# Patient Record
Sex: Female | Born: 1965 | Race: Black or African American | Hispanic: No | Marital: Single | Smoking: Current every day smoker
Health system: Southern US, Community
[De-identification: ages and names within clinical notes are randomized; demographics above are authoritative.]

## PROBLEM LIST (undated history)

## (undated) DIAGNOSIS — K219 Gastro-esophageal reflux disease without esophagitis: Secondary | ICD-10-CM

## (undated) DIAGNOSIS — J9819 Other pulmonary collapse: Secondary | ICD-10-CM

## (undated) DIAGNOSIS — D649 Anemia, unspecified: Secondary | ICD-10-CM

## (undated) HISTORY — PX: ABDOMINAL HYSTERECTOMY: SHX81

## (undated) HISTORY — PX: CHEST TUBE INSERTION: SHX231

## (undated) HISTORY — PX: OTHER SURGICAL HISTORY: SHX169

## (undated) SURGERY — LAPAROSCOPIC CHOLECYSTECTOMY
Anesthesia: General

---

## 2005-05-12 ENCOUNTER — Emergency Department: Payer: Self-pay | Admitting: Unknown Physician Specialty

## 2005-05-27 ENCOUNTER — Ambulatory Visit: Payer: Self-pay | Admitting: Family Medicine

## 2005-09-22 ENCOUNTER — Emergency Department: Payer: Self-pay | Admitting: Emergency Medicine

## 2007-06-24 ENCOUNTER — Emergency Department: Payer: Self-pay | Admitting: Emergency Medicine

## 2009-02-06 ENCOUNTER — Emergency Department: Payer: Self-pay | Admitting: Emergency Medicine

## 2010-06-01 ENCOUNTER — Emergency Department: Payer: Self-pay | Admitting: Internal Medicine

## 2011-01-08 ENCOUNTER — Emergency Department: Payer: Self-pay | Admitting: Emergency Medicine

## 2011-03-10 ENCOUNTER — Emergency Department: Payer: Self-pay | Admitting: Emergency Medicine

## 2015-05-23 ENCOUNTER — Emergency Department
Admission: EM | Admit: 2015-05-23 | Discharge: 2015-05-23 | Disposition: A | Discharge status: Home / Self Care | Payer: Self-pay | Attending: Emergency Medicine | Admitting: Emergency Medicine

## 2015-05-23 ENCOUNTER — Encounter: Payer: Self-pay | Admitting: *Deleted

## 2015-05-23 DIAGNOSIS — Z720 Tobacco use: Secondary | ICD-10-CM | POA: Insufficient documentation

## 2015-05-23 DIAGNOSIS — R079 Chest pain, unspecified: Secondary | ICD-10-CM | POA: Insufficient documentation

## 2015-05-23 DIAGNOSIS — K219 Gastro-esophageal reflux disease without esophagitis: Secondary | ICD-10-CM | POA: Insufficient documentation

## 2015-05-23 HISTORY — DX: Other pulmonary collapse: J98.19

## 2015-05-23 LAB — COMPREHENSIVE METABOLIC PANEL
ALBUMIN: 4 g/dL (ref 3.5–5.0)
ALK PHOS: 54 U/L (ref 38–126)
ALT: 14 U/L (ref 14–54)
AST: 19 U/L (ref 15–41)
Anion gap: 8 (ref 5–15)
BUN: 17 mg/dL (ref 6–20)
CO2: 29 mmol/L (ref 22–32)
CREATININE: 0.72 mg/dL (ref 0.44–1.00)
Calcium: 9.2 mg/dL (ref 8.9–10.3)
Chloride: 104 mmol/L (ref 101–111)
GFR calc non Af Amer: >60 mL/min (ref >60)
Glucose, Bld: 151 mg/dL — ABNORMAL HIGH (ref 65–99)
POTASSIUM: 3.5 mmol/L (ref 3.5–5.1)
Sodium: 141 mmol/L (ref 135–145)
TOTAL PROTEIN: 7.2 g/dL (ref 6.5–8.1)
Total Bilirubin: 0.5 mg/dL (ref 0.3–1.2)

## 2015-05-23 LAB — CBC WITH DIFFERENTIAL/PLATELET
BASOS ABS: 0 10*3/uL (ref 0–0.1)
BASOS PCT: 1 %
Eosinophils Absolute: 0 10*3/uL (ref 0–0.7)
Eosinophils Relative: 0 %
HEMATOCRIT: 36.9 % (ref 35.0–47.0)
HEMOGLOBIN: 11.5 g/dL — AB (ref 12.0–16.0)
LYMPHS PCT: 18 %
Lymphs Abs: 1.3 10*3/uL (ref 1.0–3.6)
MCH: 23.2 pg — AB (ref 26.0–34.0)
MCHC: 31.2 g/dL — AB (ref 32.0–36.0)
MCV: 74.2 fL — ABNORMAL LOW (ref 80.0–100.0)
MONO ABS: 0.3 10*3/uL (ref 0.2–0.9)
Monocytes Relative: 4 %
Neutro Abs: 5.5 10*3/uL (ref 1.4–6.5)
Neutrophils Relative %: 77 %
PLATELETS: 229 10*3/uL (ref 150–440)
RBC: 4.98 MIL/uL (ref 3.80–5.20)
RDW: 14.8 % — ABNORMAL HIGH (ref 11.5–14.5)
WBC: 7.1 10*3/uL (ref 3.6–11.0)

## 2015-05-23 LAB — TROPONIN I: Troponin I: <0.03 ng/mL (ref <0.031)

## 2015-05-23 MED ORDER — SUCRALFATE 1 G PO TABS: 1.0000 g | ORAL_TABLET | Freq: Four times a day (QID) | ORAL | Status: DC

## 2015-05-23 MED ORDER — FAMOTIDINE 20 MG PO TABS
40.0000 mg | ORAL_TABLET | Freq: Once | ORAL | Status: AC
  Administered 2015-05-23: 40 mg via ORAL
  Filled 2015-05-23: qty 2

## 2015-05-23 MED ORDER — RANITIDINE HCL 150 MG PO CAPS: 150.0000 mg | ORAL_CAPSULE | Freq: Two times a day (BID) | ORAL | Status: DC

## 2015-05-23 MED ORDER — GI COCKTAIL ~~LOC~~
30.0000 mL | ORAL | Status: AC
Start: 2015-05-23 — End: 2015-05-23
  Administered 2015-05-23: 30 mL via ORAL
  Filled 2015-05-23: qty 30

## 2015-05-23 NOTE — ED Provider Notes (Signed)
[facility] Emergency Department Provider Note  ____________________________________________  Time seen: 9:05 AM, on arrival by EMS  I have reviewed the triage vital signs and the nursing notes.   HISTORY  Chief Complaint Chest Pain and Abdominal Pain    HPI Cathy Sanchez is a 49 y.o. female who comes to the ED because of chest pain. The pain was epigastric and midsternal, burning and "a knot", nonradiating. The patient woke up around 5:00 this morning to go to the bathroom, at around 6:00 she had a gradual onset of this pain. She also felt nauseated and like her stomach was full. She did not try anything at home, but the pain was worse with movement and lying on her left side. On arrival to the ED she vomited once and the pain resolved. She reports that she does frequently have belching and problems with stomach pain after eating.  The pain is not exertional nor pleuritic. No fevers or chills. No diarrhea melena or bloody stool.   Past Medical History  Diagnosis Date  . Lung collapse     There are no active problems to display for this patient.   Past Surgical History  Procedure Laterality Date  . Abdominal hysterectomy      Current Outpatient Rx  Name  Route  Sig  Dispense  Refill  . ranitidine (ZANTAC) 150 MG capsule   Oral   Take 1 capsule (150 mg total) by mouth 2 (two) times daily.   28 capsule   0   . sucralfate (CARAFATE) 1 G tablet   Oral   Take 1 tablet (1 g total) by mouth 4 (four) times daily.   120 tablet   1     Allergies Review of patient's allergies indicates no known allergies.  History reviewed. No pertinent family history.  Social History History  Substance Use Topics  . Smoking status: Current Every Day Smoker  . Smokeless tobacco: Not on file  . Alcohol Use: Not on file    Review of Systems  Constitutional: No fever or chills. No weight changes Eyes:No blurry vision or double vision.  ENT: No sore  throat. Cardiovascular: Positive chest pain as above. Respiratory: No dyspnea or cough. Gastrointestinal: Negative for abdominal pain, vomiting and diarrhea.  No BRBPR or melena. Genitourinary: Negative for dysuria, urinary retention, bloody urine, or difficulty urinating. Musculoskeletal: Negative for back pain. No joint swelling or pain. Skin: Negative for rash. Neurological: Negative for headaches, focal weakness or numbness. Psychiatric:No anxiety or depression.   Endocrine:No hot/cold intolerance, changes in energy, or sleep difficulty.  10-point ROS otherwise negative.  ____________________________________________   PHYSICAL EXAM:  VITAL SIGNS: ED Triage Vitals  Enc Vitals Group     BP 05/23/15 0917 159/100 mmHg     Pulse Rate 05/23/15 0917 88     Resp 05/23/15 0917 20     Temp 05/23/15 0917 98.1 F (36.7 C)     Temp Source 05/23/15 0917 Oral     SpO2 05/23/15 0917 96 %     Weight 05/23/15 0917 116 lb (52.617 kg)     Height 05/23/15 0917 5\' 4"  (1.626 m)     Head Cir --      Peak Flow --      Pain Score 05/23/15 0918 4     Pain Loc --      Pain Edu? --      Excl. in [place]? --      Constitutional: Alert and oriented. Well appearing and  in no distress. Eyes: No scleral icterus. No conjunctival pallor. PERRL. EOMI ENT   Head: Normocephalic and atraumatic.   Nose: No congestion/rhinnorhea. No septal hematoma   Mouth/Throat: MMM, no pharyngeal erythema. No peritonsillar mass. No uvula shift.   Neck: No stridor. No SubQ emphysema. No meningismus. Hematological/Lymphatic/Immunilogical: No cervical lymphadenopathy. Cardiovascular: RRR. Normal and symmetric distal pulses are present in all extremities. No murmurs, rubs, or gallops. Respiratory: Normal respiratory effort without tachypnea nor retractions. Breath sounds are clear and equal bilaterally. No wheezes/rales/rhonchi. Gastrointestinal: Mild epigastric tenderness. No distention. There is no CVA  tenderness.  No rebound, rigidity, or guarding. Genitourinary: deferred Musculoskeletal: Nontender with normal range of motion in all extremities. No joint effusions.  No lower extremity tenderness.  No edema. Neurologic:   Normal speech and language.  CN 2-10 normal. Motor grossly intact. No pronator drift.  Normal gait. No gross focal neurologic deficits are appreciated.  Skin:  Skin is warm, dry and intact. No rash noted.  No petechiae, purpura, or bullae. Psychiatric: Mood and affect are normal. Speech and behavior are normal. Patient exhibits appropriate insight and judgment.  ____________________________________________    LABS (pertinent positives/negatives) (all labs ordered are listed, but only abnormal results are displayed) Labs Reviewed  COMPREHENSIVE METABOLIC PANEL - Abnormal; Notable for the following:    Glucose, Bld 151 (*)    All other components within normal limits  CBC WITH DIFFERENTIAL/PLATELET - Abnormal; Notable for the following:    Hemoglobin 11.5 (*)    MCV 74.2 (*)    MCH 23.2 (*)    MCHC 31.2 (*)    RDW 14.8 (*)    All other components within normal limits  TROPONIN I   ____________________________________________   EKG  Interpreted by me Atrial fibrillation rate of 95, normal axis and intervals, normal QRS ST segments and T waves  ____________________________________________    RADIOLOGY    ____________________________________________   PROCEDURES  ____________________________________________   INITIAL IMPRESSION / ASSESSMENT AND PLAN / ED COURSE  Pertinent labs & imaging results that were available during my care of the patient were reviewed by me and considered in my medical decision making (see chart for details).  Patient presents with chest pain and epigastric pain low suspicion for ACS TAD triple a pneumothorax PE carditis mediastinitis cholecystitis or cholangitis. Low suspicion for perforated viscus. History and exam are  consistent with GERD and gastritis. We will give the patient antacids while checking some labs. No evidence of ischemic changes on EKG. Patient is low risk by heart score.  ----------------------------------------- 11:46 AM on 05/23/2015 -----------------------------------------  Feeling better, troponin negative, we'll discharge home on antacids.  ____________________________________________   FINAL CLINICAL IMPRESSION(S) / ED DIAGNOSES  Final diagnoses:  Chest pain, unspecified chest pain type  Gastroesophageal reflux disease without esophagitis      Carrie Mew, MD 05/23/15 1146

## 2015-05-23 NOTE — Discharge Instructions (Signed)
Chest Pain (Nonspecific) °It is often hard to give a specific diagnosis for the cause of chest pain. There is always a chance that your pain could be related to something serious, such as a heart attack or a blood clot in the lungs. You need to follow up with your health care provider for further evaluation. °CAUSES  °· Heartburn. °· Pneumonia or bronchitis. °· Anxiety or stress. °· Inflammation around your heart (pericarditis) or lung (pleuritis or pleurisy). °· A blood clot in the lung. °· A collapsed lung (pneumothorax). It can develop suddenly on its own (spontaneous pneumothorax) or from trauma to the chest. °· Shingles infection (herpes zoster virus). °The chest wall is composed of bones, muscles, and cartilage. Any of these can be the source of the pain. °· The bones can be bruised by injury. °· The muscles or cartilage can be strained by coughing or overwork. °· The cartilage can be affected by inflammation and become sore (costochondritis). °DIAGNOSIS  °Lab tests or other studies may be needed to find the cause of your pain. Your health care provider may have you take a test called an ambulatory electrocardiogram (ECG). An ECG records your heartbeat patterns over a 24-hour period. You may also have other tests, such as: °· Transthoracic echocardiogram (TTE). During echocardiography, sound waves are used to evaluate how blood flows through your heart. °· Transesophageal echocardiogram (TEE). °· Cardiac monitoring. This allows your health care provider to monitor your heart rate and rhythm in real time. °· Holter monitor. This is a portable device that records your heartbeat and can help diagnose heart arrhythmias. It allows your health care provider to track your heart activity for several days, if needed. °· Stress tests by exercise or by giving medicine that makes the heart beat faster. °TREATMENT  °· Treatment depends on what may be causing your chest pain. Treatment may include: °¨ Acid blockers for  heartburn. °¨ Anti-inflammatory medicine. °¨ Pain medicine for inflammatory conditions. °¨ Antibiotics if an infection is present. °· You may be advised to change lifestyle habits. This includes stopping smoking and avoiding alcohol, caffeine, and chocolate. °· You may be advised to keep your head raised (elevated) when sleeping. This reduces the chance of acid going backward from your stomach into your esophagus. °Most of the time, nonspecific chest pain will improve within 2-3 days with rest and mild pain medicine.  °HOME CARE INSTRUCTIONS  °· If antibiotics were prescribed, take them as directed. Finish them even if you start to feel better. °· For the next few days, avoid physical activities that bring on chest pain. Continue physical activities as directed. °· Do not use any tobacco products, including cigarettes, chewing tobacco, or electronic cigarettes. °· Avoid drinking alcohol. °· Only take medicine as directed by your health care provider. °· Follow your health care provider's suggestions for further testing if your chest pain does not go away. °· Keep any follow-up appointments you made. If you do not go to an appointment, you could develop lasting (chronic) problems with pain. If there is any problem keeping an appointment, call to reschedule. °SEEK MEDICAL CARE IF:  °· Your chest pain does not go away, even after treatment. °· You have a rash with blisters on your chest. °· You have a fever. °SEEK IMMEDIATE MEDICAL CARE IF:  °· You have increased chest pain or pain that spreads to your arm, neck, jaw, back, or abdomen. °· You have shortness of breath. °· You have an increasing cough, or you cough   up blood.  You have severe back or abdominal pain.  You feel nauseous or vomit.  You have severe weakness.  You faint.  You have chills. This is an emergency. Do not wait to see if the pain will go away. Get medical help at once. Call your local emergency services (911 in [place] Do not drive  yourself to the hospital. MAKE SURE YOU:   Understand these instructions.  Will watch your condition.  Will get help right away if you are not doing well or get worse. Document Released: 08/04/2005 Document Revised: 10/30/2013 Document Reviewed: 05/30/2008 [facility] Patient Information 2015 [place], [place]. This information is not intended to replace advice given to you by your health care provider. Make sure you discuss any questions you have with your health care provider.  Gastroesophageal Reflux Disease, Adult Gastroesophageal reflux disease (GERD) happens when acid from your stomach goes into your food pipe (esophagus). The acid can cause a burning feeling in your chest. Over time, the acid can make small holes (ulcers) in your food pipe.  HOME CARE  Ask your doctor for advice about:  Losing weight.  Quitting smoking.  Alcohol use.  Avoid foods and drinks that make your problems worse. You may want to avoid:  Caffeine and alcohol.  Chocolate.  Mints.  Garlic and onions.  Spicy foods.  Citrus fruits, such as oranges, lemons, or limes.  Foods that contain tomato, such as sauce, chili, salsa, and pizza.  Fried and fatty foods.  Avoid lying down for 3 hours before you go to bed or before you take a nap.  Eat small meals often, instead of large meals.  Wear loose-fitting clothing. Do not wear anything tight around your waist.  Raise (elevate) the head of your bed 6 to 8 inches with wood blocks. Using extra pillows does not help.  Only take medicines as told by your doctor.  Do not take aspirin or ibuprofen. GET HELP RIGHT AWAY IF:   You have pain in your arms, neck, jaw, teeth, or back.  Your pain gets worse or changes.  You feel sick to your stomach (nauseous), throw up (vomit), or sweat (diaphoresis).  You feel short of breath, or you pass out (faint).  Your throw up is green, yellow, black, or looks like coffee grounds or blood.  Your poop (stool) is  red, bloody, or black. MAKE SURE YOU:   Understand these instructions.  Will watch your condition.  Will get help right away if you are not doing well or get worse. Document Released: 04/12/2008 Document Revised: 01/17/2012 Document Reviewed: 05/14/2011 [facility] Patient Information 2015 [place], [place]. This information is not intended to replace advice given to you by your health care provider. Make sure you discuss any questions you have with your health care provider.

## 2015-05-23 NOTE — ED Notes (Signed)
Pt arrives via EMS from home with complaints of chest pain mid chest, EMs reports pt was cool and clammy upon arrival, upon ER arrival pt vomited once

## 2015-06-01 ENCOUNTER — Emergency Department
Admission: EM | Admit: 2015-06-01 | Discharge: 2015-06-01 | Disposition: A | Discharge status: Home / Self Care | Payer: Self-pay | Attending: Emergency Medicine | Admitting: Emergency Medicine

## 2015-06-01 DIAGNOSIS — S01511A Laceration without foreign body of lip, initial encounter: Secondary | ICD-10-CM | POA: Insufficient documentation

## 2015-06-01 DIAGNOSIS — Z720 Tobacco use: Secondary | ICD-10-CM | POA: Insufficient documentation

## 2015-06-01 DIAGNOSIS — Y998 Other external cause status: Secondary | ICD-10-CM | POA: Insufficient documentation

## 2015-06-01 DIAGNOSIS — Z79899 Other long term (current) drug therapy: Secondary | ICD-10-CM | POA: Insufficient documentation

## 2015-06-01 DIAGNOSIS — Z23 Encounter for immunization: Secondary | ICD-10-CM | POA: Insufficient documentation

## 2015-06-01 DIAGNOSIS — Y9289 Other specified places as the place of occurrence of the external cause: Secondary | ICD-10-CM | POA: Insufficient documentation

## 2015-06-01 DIAGNOSIS — W01190A Fall on same level from slipping, tripping and stumbling with subsequent striking against furniture, initial encounter: Secondary | ICD-10-CM | POA: Insufficient documentation

## 2015-06-01 DIAGNOSIS — Y9389 Activity, other specified: Secondary | ICD-10-CM | POA: Insufficient documentation

## 2015-06-01 MED ORDER — TETANUS-DIPHTH-ACELL PERTUSSIS 5-2.5-18.5 LF-MCG/0.5 IM SUSP
0.5000 mL | Freq: Once | INTRAMUSCULAR | Status: AC
  Administered 2015-06-01: 0.5 mL via INTRAMUSCULAR
  Filled 2015-06-01: qty 0.5

## 2015-06-01 MED ORDER — TRAMADOL HCL 50 MG PO TABS: 50.0000 mg | ORAL_TABLET | Freq: Four times a day (QID) | ORAL | Status: AC | PRN

## 2015-06-01 MED ORDER — LIDOCAINE HCL (PF) 1 % IJ SOLN
INTRAMUSCULAR | Status: AC
  Administered 2015-06-01: 5 mL
  Filled 2015-06-01: qty 5

## 2015-06-01 MED ORDER — LIDOCAINE HCL (PF) 1 % IJ SOLN
INTRAMUSCULAR | Status: AC
Start: 2015-06-01 — End: 2015-06-01
  Administered 2015-06-01: 5 mL
  Filled 2015-06-01: qty 5

## 2015-06-01 MED ORDER — AMOXICILLIN-POT CLAVULANATE 875-125 MG PO TABS: 1.0000 | ORAL_TABLET | Freq: Two times a day (BID) | ORAL | Status: AC

## 2015-06-01 NOTE — ED Notes (Signed)
Pt reports falling and hitting lip on table approx 1 hr ago. +laceration to upper lip. Denies LOC.

## 2015-06-01 NOTE — Discharge Instructions (Signed)
Absorbable Suture Repair Absorbable sutures (stitches) hold skin together so you can heal. Keep skin wounds clean and dry for the next 2 to 3 days. Then, you may gently wash your wound and dress it with an antibiotic ointment as recommended. As your wound begins to heal, the sutures are no longer needed, and they typically begin to fall off. This will take 7 to 10 days. After 10 days, if your sutures are loose, you can remove them by wiping with a clean gauze pad or a cotton ball. Do not pull your sutures out. They should wipe away easily. If after 10 days they do not easily wipe away, have your caregiver take them out. Absorbable sutures may be used deep in a wound to help hold it together. If these stitches are below the skin, the body will absorb them completely in 3 to 4 weeks.  You may need a tetanus shot if:  You cannot remember when you had your last tetanus shot.  You have never had a tetanus shot. If you get a tetanus shot, your arm may swell, get red, and feel warm to the touch. This is common and not a problem. If you need a tetanus shot and you choose not to have one, there is a rare chance of getting tetanus. Sickness from tetanus can be serious. SEEK IMMEDIATE MEDICAL CARE IF:  You have redness in the wound area.  The wound area feels hot to the touch.  You develop swelling in the wound area.  You develop pain.  There is fluid drainage from the wound. Document Released: 12/02/2004 Document Revised: 01/17/2012 Document Reviewed: 03/16/2011 [facility] Patient Information 2015 [place], [place]. This information is not intended to replace advice given to you by your health care provider. Make sure you discuss any questions you have with your health care provider.  Facial Laceration A facial laceration is a cut on the face. These injuries can be painful and cause bleeding. Some cuts may need to be closed with stitches (sutures), skin adhesive strips, or wound glue. Cuts usually heal  quickly but can leave a scar. It can take 1-2 years for the scar to go away completely. HOME CARE   Only take medicines as told by your doctor.  Follow your doctor's instructions for wound care. For Stitches:  Keep the cut clean and dry.  If you have a bandage (dressing), change it at least once a day. Change the bandage if it gets wet or dirty, or as told by your doctor.  Wash the cut with soap and water 2 times a day. Rinse the cut with water. Pat it dry with a clean towel.  Put a thin layer of medicated cream on the cut as told by your doctor.  You may shower after the first 24 hours. Do not soak the cut in water until the stitches are removed.  Have your stitches removed as told by your doctor.  Do not wear any makeup until a few days after your stitches are removed. For Skin Adhesive Strips:  Keep the cut clean and dry.  Do not get the strips wet. You may take a bath, but be careful to keep the cut dry.  If the cut gets wet, pat it dry with a clean towel.  The strips will fall off on their own. Do not remove the strips that are still stuck to the cut. For Wound Glue:  You may shower or take baths. Do not soak or scrub the cut. Do  not swim. Avoid heavy sweating until the glue falls off on its own. After a shower or bath, pat the cut dry with a clean towel.  Do not put medicine or makeup on your cut until the glue falls off.  If you have a bandage, do not put tape over the glue.  Avoid lots of sunlight or tanning lamps until the glue falls off.  The glue will fall off on its own in 5-10 days. Do not pick at the glue. After Healing: Put sunscreen on the cut for the first year to reduce your scar. GET HELP RIGHT AWAY IF:   Your cut area gets red, painful, or puffy (swollen).  You see a yellowish-white fluid (pus) coming from the cut.  You have chills or a fever. MAKE SURE YOU:   Understand these instructions.  Will watch your condition.  Will get help right  away if you are not doing well or get worse. Document Released: 04/12/2008 Document Revised: 08/15/2013 Document Reviewed: 06/07/2013 [facility] Patient Information 2015 [place], [place]. This information is not intended to replace advice given to you by your health care provider. Make sure you discuss any questions you have with your health care provider.

## 2015-06-01 NOTE — ED Provider Notes (Signed)
[facility] Emergency Department Provider Note  Time seen: 2:39 AM  I have reviewed the triage vital signs and the nursing notes.   HISTORY  Chief Complaint Lip Laceration    HPI Cathy Sanchez is a 49 y.o. female who presents the emergency department with a lip laceration following a fall today. According to the patient she was drinking alcohol, she tripped and fell hitting her mouth on a coffee table. Patient denies any loss of consciousness. She suffered a laceration to her lip which brought her to the emergency department for repair. Describes her lip pain as moderate. Bleeding is controlled at this time.     Past Medical History  Diagnosis Date  . Lung collapse     There are no active problems to display for this patient.   Past Surgical History  Procedure Laterality Date  . Abdominal hysterectomy      Current Outpatient Rx  Name  Route  Sig  Dispense  Refill  . ranitidine (ZANTAC) 150 MG capsule   Oral   Take 1 capsule (150 mg total) by mouth 2 (two) times daily.   28 capsule   0   . sucralfate (CARAFATE) 1 G tablet   Oral   Take 1 tablet (1 g total) by mouth 4 (four) times daily.   120 tablet   1     Allergies Review of patient's allergies indicates no known allergies.  No family history on file.  Social History History  Substance Use Topics  . Smoking status: Current Every Day Smoker  . Smokeless tobacco: Not on file  . Alcohol Use: Not on file    Review of Systems Constitutional: Negative for fever. Cardiovascular: Negative for chest pain. Respiratory: Negative for shortness of breath. Gastrointestinal: Negative for abdominal pain Skin: Negative for rash. Laceration to lip. Neurological: Negative for headaches, focal weakness or numbness.  10-point ROS otherwise negative.  ____________________________________________   PHYSICAL EXAM:  VITAL SIGNS: ED Triage Vitals  Enc Vitals Group     BP 06/01/15 0142  122/74 mmHg     Pulse Rate 06/01/15 0142 78     Resp 06/01/15 0142 20     Temp 06/01/15 0142 98.6 F (37 C)     Temp Source 06/01/15 0142 Oral     SpO2 06/01/15 0142 98 %     Weight 06/01/15 0142 150 lb (68.04 kg)     Height 06/01/15 0142 5\' 3"  (1.6 m)     Head Cir --      Peak Flow --      Pain Score 06/01/15 0143 5     Pain Loc --      Pain Edu? --      Excl. in [place]? --     Constitutional: Alert and oriented. Well appearing and in no distress. Eyes: Normal exam, 2 mm PERRL bilaterally ENT   Head: Normocephalic   Nose: Atraumatic   Mouth/Throat: Patient with complete laceration of upper lip. Laceration is approximately 4 cm in total length. No dental or other injuries noted Cardiovascular: Normal rate, regular rhythm. No murmur Respiratory: Normal respiratory effort without tachypnea nor retractions. Breath sounds are clear Gastrointestinal: Soft and nontender. No distention.   Musculoskeletal: Nontender with normal range of motion in all extremities. Neurologic:  Normal speech and language. No gross focal neurologic deficits are appreciated. Speech is normal. Skin:  Skin is warm, dry. Laceration as above. Psychiatric: Mood and affect are normal. ____________________________________________    INITIAL IMPRESSION / ASSESSMENT  AND PLAN / ED COURSE  Pertinent labs & imaging results that were available during my care of the patient were reviewed by me and considered in my medical decision making (see chart for details).  Patient presents with a complicated laceration following a mechanical fall. No LOC, no other injuries. Patient does have a complete laceration of the upper lip. I repaired with 5-0 rapid Vicryls suture, including deep sutures, and approximation of the vermilion border.  Repair went very well, without complications. The patient tolerated the procedure very well. With great approximation of the vermilion border. Expect minimal scarring. We will cover the  patient with Augmentin given the depth of laceration and location.  LACERATION REPAIR Performed by: Harvest Dark Authorized by: Harvest Dark Consent: Verbal consent obtained. Risks and benefits: risks, benefits and alternatives were discussed Consent given by: patient Patient identity confirmed: provided demographic data Prepped and Draped in normal sterile fashion Wound explored  Laceration Location: Left upper lip  Laceration Length: 4 cm  No Foreign Bodies seen or palpated  Anesthesia: local infiltration  Local anesthetic: lidocaine 1 % without epinephrine  Anesthetic total: 8 ml  Irrigation method: syringe Amount of cleaning: standard  Skin closure: 5-0 rapid Vicryls   Number of sutures: 2 deep sutures, 11 superficial sutures.   Technique: Simple interrupted.   Patient tolerance: Patient tolerated the procedure well with no immediate complications.   ____________________________________________   FINAL CLINICAL IMPRESSION(S) / ED DIAGNOSES  Laceration   Harvest Dark, MD 06/01/15 [phone]

## 2016-05-12 ENCOUNTER — Emergency Department
Admission: EM | Admit: 2016-05-12 | Discharge: 2016-05-12 | Disposition: A | Discharge status: Home / Self Care | Payer: Self-pay | Attending: Emergency Medicine | Admitting: Emergency Medicine

## 2016-05-12 DIAGNOSIS — N390 Urinary tract infection, site not specified: Secondary | ICD-10-CM | POA: Insufficient documentation

## 2016-05-12 DIAGNOSIS — K219 Gastro-esophageal reflux disease without esophagitis: Secondary | ICD-10-CM

## 2016-05-12 DIAGNOSIS — F17210 Nicotine dependence, cigarettes, uncomplicated: Secondary | ICD-10-CM | POA: Insufficient documentation

## 2016-05-12 DIAGNOSIS — R1013 Epigastric pain: Secondary | ICD-10-CM

## 2016-05-12 DIAGNOSIS — Z79899 Other long term (current) drug therapy: Secondary | ICD-10-CM | POA: Insufficient documentation

## 2016-05-12 LAB — URINALYSIS COMPLETE WITH MICROSCOPIC (ARMC ONLY)
Bilirubin Urine: NEGATIVE
Glucose, UA: NEGATIVE mg/dL
Ketones, ur: NEGATIVE mg/dL
NITRITE: POSITIVE — AB
PH: 6 (ref 5.0–8.0)
PROTEIN: NEGATIVE mg/dL
SPECIFIC GRAVITY, URINE: 1.02 (ref 1.005–1.030)

## 2016-05-12 LAB — TROPONIN I: Troponin I: <0.03 ng/mL (ref <0.03)

## 2016-05-12 LAB — CBC
HEMATOCRIT: 36.2 % (ref 35.0–47.0)
Hemoglobin: 11.8 g/dL — ABNORMAL LOW (ref 12.0–16.0)
MCH: 23.4 pg — ABNORMAL LOW (ref 26.0–34.0)
MCHC: 32.6 g/dL (ref 32.0–36.0)
MCV: 71.8 fL — AB (ref 80.0–100.0)
PLATELETS: 231 10*3/uL (ref 150–440)
RBC: 5.05 MIL/uL (ref 3.80–5.20)
RDW: 14.6 % — AB (ref 11.5–14.5)
WBC: 9.1 10*3/uL (ref 3.6–11.0)

## 2016-05-12 LAB — COMPREHENSIVE METABOLIC PANEL
ALBUMIN: 4 g/dL (ref 3.5–5.0)
ALK PHOS: 67 U/L (ref 38–126)
ALT: 22 U/L (ref 14–54)
AST: 26 U/L (ref 15–41)
Anion gap: 6 (ref 5–15)
BUN: 18 mg/dL (ref 6–20)
CHLORIDE: 106 mmol/L (ref 101–111)
CO2: 28 mmol/L (ref 22–32)
CREATININE: 0.62 mg/dL (ref 0.44–1.00)
Calcium: 9 mg/dL (ref 8.9–10.3)
GFR calc Af Amer: >60 mL/min (ref >60)
GFR calc non Af Amer: >60 mL/min (ref >60)
GLUCOSE: 92 mg/dL (ref 65–99)
Potassium: 3.5 mmol/L (ref 3.5–5.1)
Sodium: 140 mmol/L (ref 135–145)
Total Bilirubin: 0.4 mg/dL (ref 0.3–1.2)
Total Protein: 7 g/dL (ref 6.5–8.1)

## 2016-05-12 LAB — LIPASE, BLOOD: Lipase: 21 U/L (ref 11–51)

## 2016-05-12 MED ORDER — SUCRALFATE 1 G PO TABS: 1.0000 g | ORAL_TABLET | Freq: Four times a day (QID) | ORAL | Status: DC

## 2016-05-12 MED ORDER — NITROFURANTOIN MONOHYD MACRO 100 MG PO CAPS: 100.0000 mg | ORAL_CAPSULE | Freq: Two times a day (BID) | ORAL | Status: DC

## 2016-05-12 MED ORDER — FAMOTIDINE 20 MG PO TABS: 20.0000 mg | ORAL_TABLET | Freq: Two times a day (BID) | ORAL | Status: DC

## 2016-05-12 MED ORDER — GI COCKTAIL ~~LOC~~
30.0000 mL | Freq: Once | ORAL | Status: AC
  Administered 2016-05-12: 30 mL via ORAL
  Filled 2016-05-12: qty 30

## 2016-05-12 MED ORDER — FAMOTIDINE 20 MG PO TABS
20.0000 mg | ORAL_TABLET | Freq: Once | ORAL | Status: AC
  Administered 2016-05-12: 20 mg via ORAL
  Filled 2016-05-12: qty 1

## 2016-05-12 NOTE — ED Notes (Signed)
Pt sound asleep, respirations WNL.. Will continue to monitor the pt.

## 2016-05-12 NOTE — Discharge Instructions (Signed)
Abdominal Pain, Adult °Many things can cause abdominal pain. Usually, abdominal pain is not caused by a disease and will improve without treatment. It can often be observed and treated at home. Your health care provider will do a physical exam and possibly order blood tests and X-rays to help determine the seriousness of your pain. However, in many cases, more time must pass before a clear cause of the pain can be found. Before that point, your health care provider may not know if you need more testing or further treatment. °HOME CARE INSTRUCTIONS °Monitor your abdominal pain for any changes. The following actions may help to alleviate any discomfort you are experiencing: °· Only take over-the-counter or prescription medicines as directed by your health care provider. °· Do not take laxatives unless directed to do so by your health care provider. °· Try a clear liquid diet (broth, tea, or water) as directed by your health care provider. Slowly move to a bland diet as tolerated. °SEEK MEDICAL CARE IF: °· You have unexplained abdominal pain. °· You have abdominal pain associated with nausea or diarrhea. °· You have pain when you urinate or have a bowel movement. °· You experience abdominal pain that wakes you in the night. °· You have abdominal pain that is worsened or improved by eating food. °· You have abdominal pain that is worsened with eating fatty foods. °· You have a fever. °SEEK IMMEDIATE MEDICAL CARE IF: °· Your pain does not go away within 2 hours. °· You keep throwing up (vomiting). °· Your pain is felt only in portions of the abdomen, such as the right side or the left lower portion of the abdomen. °· You pass bloody or black tarry stools. °MAKE SURE YOU: °· Understand these instructions. °· Will watch your condition. °· Will get help right away if you are not doing well or get worse. °  °This information is not intended to replace advice given to you by your health care provider. Make sure you discuss  any questions you have with your health care provider. °  °Document Released: 08/04/2005 Document Revised: 07/16/2015 Document Reviewed: 07/04/2013 °Elsevier Interactive Patient Education ©2016 [employer] °Urinary Tract Infection °Urinary tract infections (UTIs) can develop anywhere along your urinary tract. Your urinary tract is your body's drainage system for removing wastes and extra water. Your urinary tract includes two kidneys, two ureters, a bladder, and a urethra. Your kidneys are a pair of bean-shaped organs. Each kidney is about the size of your fist. They are located below your ribs, one on each side of your spine. °CAUSES °Infections are caused by microbes, which are microscopic organisms, including fungi, viruses, and bacteria. These organisms are so small that they can only be seen through a microscope. Bacteria are the microbes that most commonly cause UTIs. °SYMPTOMS  °Symptoms of UTIs may vary by age and gender of the patient and by the location of the infection. Symptoms in young women typically include a frequent and intense urge to urinate and a painful, burning feeling in the bladder or urethra during urination. Older women and men are more likely to be tired, shaky, and weak and have muscle aches and abdominal pain. A fever may mean the infection is in your kidneys. Other symptoms of a kidney infection include pain in your back or sides below the ribs, nausea, and vomiting. °DIAGNOSIS °To diagnose a UTI, your caregiver will ask you about your symptoms. Your caregiver will also ask you to provide a urine sample. The   urine sample will be tested for bacteria and white blood cells. White blood cells are made by your body to help fight infection. °TREATMENT  °Typically, UTIs can be treated with medication. Because most UTIs are caused by a bacterial infection, they usually can be treated with the use of antibiotics. The choice of antibiotic and length of treatment depend on your symptoms and the  type of bacteria causing your infection. °HOME CARE INSTRUCTIONS °· If you were prescribed antibiotics, take them exactly as your caregiver instructs you. Finish the medication even if you feel better after you have only taken some of the medication. °· Drink enough water and fluids to keep your urine clear or pale yellow. °· Avoid caffeine, tea, and carbonated beverages. They tend to irritate your bladder. °· Empty your bladder often. Avoid holding urine for long periods of time. °· Empty your bladder before and after sexual intercourse. °· After a bowel movement, women should cleanse from front to back. Use each tissue only once. °SEEK MEDICAL CARE IF:  °· You have back pain. °· You develop a fever. °· Your symptoms do not begin to resolve within 3 days. °SEEK IMMEDIATE MEDICAL CARE IF:  °· You have severe back pain or lower abdominal pain. °· You develop chills. °· You have nausea or vomiting. °· You have continued burning or discomfort with urination. °MAKE SURE YOU:  °· Understand these instructions. °· Will watch your condition. °· Will get help right away if you are not doing well or get worse. °  °This information is not intended to replace advice given to you by your health care provider. Make sure you discuss any questions you have with your health care provider. °  °Document Released: 08/04/2005 Document Revised: 07/16/2015 Document Reviewed: 12/03/2011 °Elsevier Interactive Patient Education ©2016 [employer] ° °

## 2016-05-12 NOTE — ED Provider Notes (Addendum)
[facility] Emergency Department Provider Note        Time seen: ----------------------------------------- 1:17 PM on 05/12/2016 -----------------------------------------    I have reviewed the triage vital signs and the nursing notes.   HISTORY  Chief Complaint Abdominal Pain    HPI Cathy Sanchez is a 50 y.o. female who presents to ER for epigastric pain and lower chest wall pain for the past month. Patient states it feels like a rock is in there. She states nothing has made it better, specifically Tums and an acids. She states she's had this daily for the last month, states she does drink alcohol on occasion, she does smoke. Patient states she used to drink heavily but no longer does so. She denies fevers, chills or other complaints.   Past Medical History  Diagnosis Date  . Lung collapse     There are no active problems to display for this patient.   Past Surgical History  Procedure Laterality Date  . Abdominal hysterectomy      Allergies Review of patient's allergies indicates no known allergies.  Social History Social History  Substance Use Topics  . Smoking status: Current Every Day Smoker    Types: Cigarettes  . Smokeless tobacco: None  . Alcohol Use: Yes    Review of Systems Constitutional: Negative for fever. Eyes: Negative for visual changes. ENT: Negative for sore throat. Cardiovascular: Negative for chest pain. Respiratory: Negative for shortness of breath. Gastrointestinal: Positive for abdominal pain Genitourinary: Negative for dysuria. Musculoskeletal: Negative for back pain. Skin: Negative for rash. Neurological: Negative for headaches, focal weakness or numbness.  10-point ROS otherwise negative.  ____________________________________________   PHYSICAL EXAM:  VITAL SIGNS: ED Triage Vitals  Enc Vitals Group     BP 05/12/16 1151 147/92 mmHg     Pulse Rate 05/12/16 1151 68     Resp 05/12/16 1151 18   Temp 05/12/16 1151 98.1 F (36.7 C)     Temp Source 05/12/16 1151 Oral     SpO2 05/12/16 1151 99 %     Weight 05/12/16 1151 120 lb (54.432 kg)     Height 05/12/16 1151 5\' 3"  (1.6 m)     Head Cir --      Peak Flow --      Pain Score 05/12/16 1156 7     Pain Loc --      Pain Edu? --      Excl. in [place]? --     Constitutional: Alert and oriented. Well appearing and in no distress. Eyes: Conjunctivae are normal. PERRL. Normal extraocular movements. ENT   Head: Normocephalic and atraumatic.   Nose: No congestion/rhinnorhea.   Mouth/Throat: Mucous membranes are moist.   Neck: No stridor. Cardiovascular: Normal rate, regular rhythm. No murmurs, rubs, or gallops. Respiratory: Normal respiratory effort without tachypnea nor retractions. Breath sounds are clear and equal bilaterally. No wheezes/rales/rhonchi. Gastrointestinal: Soft and nontender. Normal bowel sounds Musculoskeletal: Nontender with normal range of motion in all extremities. No lower extremity tenderness nor edema. Neurologic:  Normal speech and language. No gross focal neurologic deficits are appreciated.  Skin:  Skin is warm, dry and intact. No rash noted. Psychiatric: Mood and affect are normal. Speech and behavior are normal.  ____________________________________________  EKG: Interpreted by me.Sinus bradycardia with a rate of 45 bpm, normal PR interval, normal QRS, normal QT interval. Normal axis. Nonspecific T-wave changes  ____________________________________________  ED COURSE:  Pertinent labs & imaging results that were available during my care of the patient were  reviewed by me and considered in my medical decision making (see chart for details). Patient presents to the ER with epigastric pain which is likely gastritis related. I will check basic labs and imaging if necessary. ____________________________________________    LABS (pertinent positives/negatives)  Labs Reviewed  CBC - Abnormal; Notable  for the following:    Hemoglobin 11.8 (*)    MCV 71.8 (*)    MCH 23.4 (*)    RDW 14.6 (*)    All other components within normal limits  URINALYSIS COMPLETEWITH MICROSCOPIC ([facility] ONLY) - Abnormal; Notable for the following:    Color, Urine YELLOW (*)    APPearance HAZY (*)    Hgb urine dipstick 2+ (*)    Nitrite POSITIVE (*)    Leukocytes, UA TRACE (*)    Bacteria, UA MANY (*)    Squamous Epithelial / LPF 0-5 (*)    All other components within normal limits  LIPASE, BLOOD  COMPREHENSIVE METABOLIC PANEL  TROPONIN I   ____________________________________________  FINAL ASSESSMENT AND PLAN  Abdominal pain, GERD, UTI  Plan: Patient with labs and imaging as dictated above. Patient presents to ER with multiple symptoms. She does have UTI, otherwise labs are unremarkable. She'll be discharged on Pepcid, Macrobid and is encouraged to stop drinking alcohol.   Earleen Newport, MD   Note: This dictation was prepared with Dragon dictation. Any transcriptional errors that result from this process are unintentional   Earleen Newport, MD 05/12/16 1426  Earleen Newport, MD 05/12/16 [phone]

## 2016-05-12 NOTE — ED Notes (Signed)
Pt c/o upper/epigastric pain for the past month, states "it feels like a rock is in there".,. Denies N/V/D.Cathy Sanchez

## 2016-07-06 ENCOUNTER — Encounter: Payer: Self-pay | Admitting: Emergency Medicine

## 2016-07-06 ENCOUNTER — Emergency Department
Admission: EM | Admit: 2016-07-06 | Discharge: 2016-07-06 | Disposition: A | Discharge status: Home / Self Care | Payer: Self-pay | Attending: Emergency Medicine | Admitting: Emergency Medicine

## 2016-07-06 DIAGNOSIS — F17210 Nicotine dependence, cigarettes, uncomplicated: Secondary | ICD-10-CM | POA: Insufficient documentation

## 2016-07-06 DIAGNOSIS — K2970 Gastritis, unspecified, without bleeding: Secondary | ICD-10-CM | POA: Insufficient documentation

## 2016-07-06 DIAGNOSIS — Z79899 Other long term (current) drug therapy: Secondary | ICD-10-CM | POA: Insufficient documentation

## 2016-07-06 LAB — CBC
HEMATOCRIT: 39.1 % (ref 35.0–47.0)
Hemoglobin: 12.7 g/dL (ref 12.0–16.0)
MCH: 23.5 pg — ABNORMAL LOW (ref 26.0–34.0)
MCHC: 32.5 g/dL (ref 32.0–36.0)
MCV: 72.3 fL — ABNORMAL LOW (ref 80.0–100.0)
Platelets: 229 10*3/uL (ref 150–440)
RBC: 5.41 MIL/uL — ABNORMAL HIGH (ref 3.80–5.20)
RDW: 13.9 % (ref 11.5–14.5)
WBC: 7 10*3/uL (ref 3.6–11.0)

## 2016-07-06 LAB — LIPASE, BLOOD: LIPASE: 24 U/L (ref 11–51)

## 2016-07-06 LAB — COMPREHENSIVE METABOLIC PANEL
ALBUMIN: 4.2 g/dL (ref 3.5–5.0)
ALT: 41 U/L (ref 14–54)
AST: 30 U/L (ref 15–41)
Alkaline Phosphatase: 87 U/L (ref 38–126)
Anion gap: 6 (ref 5–15)
BUN: 18 mg/dL (ref 6–20)
CALCIUM: 9.3 mg/dL (ref 8.9–10.3)
CHLORIDE: 102 mmol/L (ref 101–111)
CO2: 30 mmol/L (ref 22–32)
Creatinine, Ser: 0.64 mg/dL (ref 0.44–1.00)
GFR calc Af Amer: >60 mL/min (ref >60)
GLUCOSE: 98 mg/dL (ref 65–99)
Potassium: 3.6 mmol/L (ref 3.5–5.1)
Sodium: 138 mmol/L (ref 135–145)
Total Bilirubin: 0.4 mg/dL (ref 0.3–1.2)
Total Protein: 7.6 g/dL (ref 6.5–8.1)

## 2016-07-06 LAB — URINALYSIS COMPLETE WITH MICROSCOPIC (ARMC ONLY)
BACTERIA UA: NONE SEEN
BILIRUBIN URINE: NEGATIVE
Glucose, UA: NEGATIVE mg/dL
LEUKOCYTES UA: NEGATIVE
NITRITE: NEGATIVE
Protein, ur: 30 mg/dL — AB
SPECIFIC GRAVITY, URINE: 1.024 (ref 1.005–1.030)
pH: 7 (ref 5.0–8.0)

## 2016-07-06 MED ORDER — GI COCKTAIL ~~LOC~~
30.0000 mL | Freq: Once | ORAL | Status: AC
Start: 2016-07-06 — End: 2016-07-06
  Administered 2016-07-06: 30 mL via ORAL

## 2016-07-06 MED ORDER — GI COCKTAIL ~~LOC~~
ORAL | Status: AC
  Administered 2016-07-06: 30 mL via ORAL
  Filled 2016-07-06: qty 30

## 2016-07-06 MED ORDER — SUCRALFATE 1 G PO TABS: 1.0000 g | ORAL_TABLET | Freq: Four times a day (QID) | ORAL | 0 refills | Status: DC

## 2016-07-06 NOTE — ED Triage Notes (Signed)
C/O abdominal pain x 1 month.  Seen through ED and treated for UTI one month ago. Patient states she completed antibiotic.  C/O mid abdominal pain, intermittent.  Denies Nausea/ vomiting.

## 2016-07-06 NOTE — ED Provider Notes (Signed)
[facility] Emergency Department Provider Note    ____________________________________________   I have reviewed the triage vital signs and the nursing notes.   HISTORY  Chief Complaint Abdominal Pain   History limited by: Not Limited   HPI Cathy Sanchez is a 50 y.o. female who presents to the emergency department today with continued epigastric pain. This pain is been present for at least one month. She states she has a hard time describing it but that it has been fairly persistent. It is not been associated with any nausea or vomiting. She was seen in the emergency department last month. She was given an antacid but has not been taking it regularly. She only tried 2 pills. She did not get any relief from this so stopped taking it. Patient does have occasional alcohol use. Denies daily drinking. Denies any fevers.   Past Medical History:  Diagnosis Date  . Lung collapse     There are no active problems to display for this patient.   Past Surgical History:  Procedure Laterality Date  . ABDOMINAL HYSTERECTOMY      Prior to Admission medications   Medication Sig Start Date End Date Taking? Authorizing Provider  famotidine (PEPCID) 20 MG tablet Take 1 tablet (20 mg total) by mouth 2 (two) times daily. 05/12/16   Earleen Newport, MD  nitrofurantoin, macrocrystal-monohydrate, (MACROBID) 100 MG capsule Take 1 capsule (100 mg total) by mouth 2 (two) times daily. 05/12/16   Earleen Newport, MD  ranitidine (ZANTAC) 150 MG capsule Take 1 capsule (150 mg total) by mouth 2 (two) times daily. 05/23/15   Carrie Mew, MD  sucralfate (CARAFATE) 1 G tablet Take 1 tablet (1 g total) by mouth 4 (four) times daily. 05/23/15   Carrie Mew, MD  sucralfate (CARAFATE) 1 g tablet Take 1 tablet (1 g total) by mouth 4 (four) times daily. 05/12/16 05/12/17  Earleen Newport, MD    Allergies Review of patient's allergies indicates no known allergies.  No family  history on file.  Social History Social History  Substance Use Topics  . Smoking status: Current Every Day Smoker    Types: Cigarettes  . Smokeless tobacco: Never Used  . Alcohol use Yes    Review of Systems  Constitutional: Negative for fever. Cardiovascular: Negative for chest pain. Respiratory: Negative for shortness of breath. Gastrointestinal: Positive for epigastric pain. Genitourinary: Negative for dysuria. Neurological: Negative for headaches, focal weakness or numbness.  10-point ROS otherwise negative.  ____________________________________________   PHYSICAL EXAM:  VITAL SIGNS: ED Triage Vitals  Enc Vitals Group     BP 07/06/16 1827 (!) 166/100     Pulse Rate 07/06/16 1827 69     Resp 07/06/16 1827 17     Temp 07/06/16 1827 98.8 F (37.1 C)     Temp Source 07/06/16 1827 Oral     SpO2 07/06/16 1827 100 %     Weight 07/06/16 1827 120 lb (54.4 kg)     Height 07/06/16 1827 5\' 3"  (1.6 m)     Head Circumference --      Peak Flow --      Pain Score 07/06/16 1836 5   Constitutional: Alert and oriented. Well appearing and in no distress. Eyes: Conjunctivae are normal. Normal extraocular movements. ENT   Head: Normocephalic and atraumatic.   Nose: No congestion/rhinnorhea.   Mouth/Throat: Mucous membranes are moist.   Neck: No stridor. Hematological/Lymphatic/Immunilogical: No cervical lymphadenopathy. Cardiovascular: Normal rate, regular rhythm.  No murmurs, rubs,  or gallops. Respiratory: Normal respiratory effort without tachypnea nor retractions. Breath sounds are clear and equal bilaterally. No wheezes/rales/rhonchi. Gastrointestinal: Soft and nontender. No distention.  Genitourinary: Deferred Musculoskeletal: Normal range of motion in all extremities. No lower extremity edema. Neurologic:  Normal speech and language. No gross focal neurologic deficits are appreciated.  Skin:  Skin is warm, dry and intact. No rash noted. Psychiatric: Mood and  affect are normal. Speech and behavior are normal. Patient exhibits appropriate insight and judgment.  ____________________________________________    LABS (pertinent positives/negatives)  Labs Reviewed  CBC - Abnormal; Notable for the following:       Result Value   RBC 5.41 (*)    MCV 72.3 (*)    MCH 23.5 (*)    All other components within normal limits  URINALYSIS COMPLETEWITH MICROSCOPIC ([facility] ONLY) - Abnormal; Notable for the following:    Color, Urine YELLOW (*)    APPearance HAZY (*)    Ketones, ur 1+ (*)    Hgb urine dipstick 1+ (*)    Protein, ur 30 (*)    Squamous Epithelial / LPF 0-5 (*)    All other components within normal limits  LIPASE, BLOOD  COMPREHENSIVE METABOLIC PANEL     ____________________________________________   EKG  None  ____________________________________________    RADIOLOGY  None  ____________________________________________   PROCEDURES  Procedures  ____________________________________________   INITIAL IMPRESSION / ASSESSMENT AND PLAN / ED COURSE  Pertinent labs & imaging results that were available during my care of the patient were reviewed by me and considered in my medical decision making (see chart for details).  Patient presented to the emergency department today with continued epigastric pain. Do think this is likely gastritis. The patient states that she has not been taking any antacids regularly. I discussed with the patient that it is important for her to take it regularly. Will give patient prescription for sucralfate as well. ____________________________________________   FINAL CLINICAL IMPRESSION(S) / ED DIAGNOSES  Final diagnoses:  Gastritis     Note: This dictation was prepared with Dragon dictation. Any transcriptional errors that result from this process are unintentional    Nance Pear, MD 07/06/16 2117

## 2016-07-06 NOTE — ED Notes (Signed)
Pt discharged to home.  Family member driving.  Discharge instructions reviewed.  Verbalized understanding.  No questions or concerns at this time.  Teach back verified.  Pt in NAD.  No items left in ED.   

## 2016-07-06 NOTE — Discharge Instructions (Signed)
As we discussed you need to take your pepcid (antacid) regularly to see any benefit. Please seek medical attention for any high fevers, chest pain, shortness of breath, change in behavior, persistent vomiting, bloody stool or any other new or concerning symptoms.

## 2016-11-11 ENCOUNTER — Encounter: Payer: Self-pay | Admitting: Emergency Medicine

## 2016-11-11 ENCOUNTER — Emergency Department: Payer: Self-pay

## 2016-11-11 ENCOUNTER — Emergency Department
Admission: EM | Admit: 2016-11-11 | Discharge: 2016-11-11 | Disposition: A | Discharge status: Home / Self Care | Payer: Self-pay | Attending: Emergency Medicine | Admitting: Emergency Medicine

## 2016-11-11 DIAGNOSIS — K219 Gastro-esophageal reflux disease without esophagitis: Secondary | ICD-10-CM | POA: Insufficient documentation

## 2016-11-11 DIAGNOSIS — Z79899 Other long term (current) drug therapy: Secondary | ICD-10-CM | POA: Insufficient documentation

## 2016-11-11 DIAGNOSIS — R1013 Epigastric pain: Secondary | ICD-10-CM

## 2016-11-11 DIAGNOSIS — Z791 Long term (current) use of non-steroidal anti-inflammatories (NSAID): Secondary | ICD-10-CM | POA: Insufficient documentation

## 2016-11-11 DIAGNOSIS — K8080 Other cholelithiasis without obstruction: Secondary | ICD-10-CM | POA: Insufficient documentation

## 2016-11-11 DIAGNOSIS — F17210 Nicotine dependence, cigarettes, uncomplicated: Secondary | ICD-10-CM | POA: Insufficient documentation

## 2016-11-11 LAB — CBC
HEMATOCRIT: 34.4 % — AB (ref 35.0–47.0)
Hemoglobin: 11.2 g/dL — ABNORMAL LOW (ref 12.0–16.0)
MCH: 23.1 pg — ABNORMAL LOW (ref 26.0–34.0)
MCHC: 32.5 g/dL (ref 32.0–36.0)
MCV: 71.2 fL — AB (ref 80.0–100.0)
PLATELETS: 252 10*3/uL (ref 150–440)
RBC: 4.83 MIL/uL (ref 3.80–5.20)
RDW: 14.4 % (ref 11.5–14.5)
WBC: 7.8 10*3/uL (ref 3.6–11.0)

## 2016-11-11 LAB — BASIC METABOLIC PANEL
Anion gap: 3 — ABNORMAL LOW (ref 5–15)
BUN: 15 mg/dL (ref 6–20)
CO2: 30 mmol/L (ref 22–32)
CREATININE: 0.49 mg/dL (ref 0.44–1.00)
Calcium: 9.1 mg/dL (ref 8.9–10.3)
Chloride: 106 mmol/L (ref 101–111)
GFR calc Af Amer: >60 mL/min (ref >60)
GLUCOSE: 100 mg/dL — AB (ref 65–99)
POTASSIUM: 3.5 mmol/L (ref 3.5–5.1)
Sodium: 139 mmol/L (ref 135–145)

## 2016-11-11 LAB — URINALYSIS, COMPLETE (UACMP) WITH MICROSCOPIC
Bacteria, UA: NONE SEEN
Bilirubin Urine: NEGATIVE
GLUCOSE, UA: NEGATIVE mg/dL
Ketones, ur: NEGATIVE mg/dL
Leukocytes, UA: NEGATIVE
Nitrite: NEGATIVE
PH: 6 (ref 5.0–8.0)
Protein, ur: NEGATIVE mg/dL
Specific Gravity, Urine: 1.018 (ref 1.005–1.030)

## 2016-11-11 LAB — HEPATIC FUNCTION PANEL
ALK PHOS: 77 U/L (ref 38–126)
ALT: 27 U/L (ref 14–54)
AST: 24 U/L (ref 15–41)
Albumin: 3.7 g/dL (ref 3.5–5.0)
BILIRUBIN TOTAL: 0.6 mg/dL (ref 0.3–1.2)
Bilirubin, Direct: <0.1 mg/dL — ABNORMAL LOW (ref 0.1–0.5)
TOTAL PROTEIN: 7.1 g/dL (ref 6.5–8.1)

## 2016-11-11 LAB — TROPONIN I: Troponin I: <0.03 ng/mL (ref <0.03)

## 2016-11-11 LAB — LIPASE, BLOOD: LIPASE: 17 U/L (ref 11–51)

## 2016-11-11 MED ORDER — GI COCKTAIL ~~LOC~~
30.0000 mL | Freq: Once | ORAL | Status: AC
  Administered 2016-11-11: 30 mL via ORAL
  Filled 2016-11-11: qty 30

## 2016-11-11 MED ORDER — SUCRALFATE 1 G PO TABS: 1.0000 g | ORAL_TABLET | Freq: Four times a day (QID) | ORAL | 0 refills | Status: DC

## 2016-11-11 MED ORDER — RANITIDINE HCL 150 MG PO TABS: 150.0000 mg | ORAL_TABLET | Freq: Two times a day (BID) | ORAL | 0 refills | Status: DC

## 2016-11-11 NOTE — ED Notes (Signed)
Given phone to call family

## 2016-11-11 NOTE — ED Notes (Signed)
Returned from [facility]

## 2016-11-11 NOTE — ED Triage Notes (Signed)
Pt arrived by EMS from home with c/o of chest pain. EMS reports pt has had central chest pain x3 months. Pt A&O x4 upon arrival, walking around room.

## 2016-11-11 NOTE — ED Provider Notes (Signed)
[facility] Emergency Department Provider Note  ____________________________________________   First MD Initiated Contact with Patient 11/11/16 [phone]     (approximate)  I have reviewed the triage vital signs and the nursing notes.   HISTORY  Chief Complaint Chest Pain   HPI Hailea Mcclarin is a 51 y.o. female without any chronic medical conditions was presenting to the emergency department today with several months for upper abdominal pain which is now radiating into her chest. She is unable to qualify what her chest and feels like but says it is now a 4-5 out of 10 without any radiation. She does wear a back pain but says that this back pain is chronic. Denies any exertional worsening. Says that the ear would make her abdominal pain worse over the past several months and this is why she stopped drinking. She said that over the past 3 hours the pain has now radiated up into her chest. She denies any nausea or vomiting. She says that she was previously on sucralfate for abdominal pain that she is out of this at this time. She does not follow-up in office because she does not have a doctor to see outpatient. Denies any urinary symptoms.  Does not report any vaginal bleeding or discharge.She points to the middle of her sternum when describing the location of her chest pain.   Past Medical History:  Diagnosis Date  . Lung collapse     There are no active problems to display for this patient.   Past Surgical History:  Procedure Laterality Date  . ABDOMINAL HYSTERECTOMY      Prior to Admission medications   Medication Sig Start Date End Date Taking? Authorizing Provider  acetaminophen (TYLENOL) 325 MG tablet Take 325-650 mg by mouth every 6 (six) hours as needed.   Yes Historical Provider, MD  ibuprofen (ADVIL,MOTRIN) 200 MG tablet Take 200-400 mg by mouth every 6 (six) hours as needed.   Yes Historical Provider, MD  sucralfate (CARAFATE) 1 g tablet Take 1  tablet (1 g total) by mouth 4 (four) times daily. 07/06/16  Yes Nance Pear, MD  famotidine (PEPCID) 20 MG tablet Take 1 tablet (20 mg total) by mouth 2 (two) times daily. Patient not taking: Reported on 11/11/2016 05/12/16   Earleen Newport, MD  nitrofurantoin, macrocrystal-monohydrate, (MACROBID) 100 MG capsule Take 1 capsule (100 mg total) by mouth 2 (two) times daily. Patient not taking: Reported on 11/11/2016 05/12/16   Earleen Newport, MD  ranitidine (ZANTAC) 150 MG capsule Take 1 capsule (150 mg total) by mouth 2 (two) times daily. Patient not taking: Reported on 11/11/2016 05/23/15   Carrie Mew, MD    Allergies Patient has no known allergies.  History reviewed. No pertinent family history.  Social History Social History  Substance Use Topics  . Smoking status: Current Every Day Smoker    Types: Cigarettes  . Smokeless tobacco: Never Used  . Alcohol use Yes    Review of Systems Constitutional: No fever/chills Eyes: No visual changes. ENT: No sore throat. Cardiovascular: As above Respiratory: Denies shortness of breath. Gastrointestinal: As above. No nausea, no vomiting.  No diarrhea.  No constipation. Genitourinary: Negative for dysuria. Musculoskeletal: Negative for back pain. Skin: Negative for rash. Neurological: Negative for headaches, focal weakness or numbness.  10-point ROS otherwise negative.  ____________________________________________   PHYSICAL EXAM:  VITAL SIGNS: ED Triage Vitals [11/11/16 0658]  Enc Vitals Group     BP      Pulse  Resp      Temp      Temp src      SpO2      Weight 120 lb (54.4 kg)     Height 5\' 4"  (1.626 m)     Head Circumference      Peak Flow      Pain Score 7     Pain Loc      Pain Edu?      Excl. in [place]?     Constitutional: Alert and oriented. Well appearing and in no acute distress. Eyes: Conjunctivae are normal. PERRL. EOMI. Head: Atraumatic. Nose: No congestion/rhinnorhea. Mouth/Throat: Mucous  membranes are moist.   Neck: No stridor.   Cardiovascular: Normal rate, regular rhythm. Grossly normal heart sounds.  Equal and bilateral radial pulses. Respiratory: Normal respiratory effort.  No retractions. Lungs CTAB. Gastrointestinal: Soft with minimal epigastric as well as left upper quadrant tenderness palpation. Negative Murphy sign. No distention. No CVA tenderness. Musculoskeletal: No lower extremity tenderness nor edema.  No joint effusions. Neurologic:  Normal speech and language. No gross focal neurologic deficits are appreciated. No gait instability. Skin:  Skin is warm, dry and intact. No rash noted. Psychiatric: Mood and affect are normal. Speech and behavior are normal.  ____________________________________________   LABS (all labs ordered are listed, but only abnormal results are displayed)  Labs Reviewed  BASIC METABOLIC PANEL - Abnormal; Notable for the following:       Result Value   Glucose, Bld 100 (*)    Anion gap 3 (*)    All other components within normal limits  CBC - Abnormal; Notable for the following:    Hemoglobin 11.2 (*)    HCT 34.4 (*)    MCV 71.2 (*)    MCH 23.1 (*)    All other components within normal limits  HEPATIC FUNCTION PANEL - Abnormal; Notable for the following:    Bilirubin, Direct <0.1 (*)    All other components within normal limits  URINALYSIS, COMPLETE (UACMP) WITH MICROSCOPIC - Abnormal; Notable for the following:    Color, Urine YELLOW (*)    APPearance CLEAR (*)    Hgb urine dipstick MODERATE (*)    Squamous Epithelial / LPF 0-5 (*)    All other components within normal limits  TROPONIN I  LIPASE, BLOOD   ____________________________________________  EKG  ED ECG REPORT I, Doran Stabler, the attending physician, personally viewed and interpreted this ECG.   Date: 11/11/2016  EKG Time: 0703  Rate: 73  Rhythm: normal sinus rhythm  Axis: Normal  Intervals:none  ST&T Change: No ST segment elevation or  depression. No abnormal T-wave inversion.  ____________________________________________  [id number]  DG Chest 2 View (Final result)  Result time 11/11/16 07:31:45  Final result by David A Martinique, MD (11/11/16 07:31:45)           Narrative:   CLINICAL DATA: History of pneumothorax. Chest pain.  EXAM: CHEST 2 VIEW  COMPARISON: Chest x-ray report of January 30, 2002  FINDINGS: The lungs are hyperinflated with hemidiaphragm flattening. There is no pneumothorax, pneumomediastinum, or pleural effusion. The heart and pulmonary vascularity are normal. The mediastinum is normal in width. There is mild dextrocurvature of the thoracic spine. There is calcification in the wall of the aortic arch.  IMPRESSION: COPD. No pneumothorax nor other acute cardiopulmonary abnormality.  Thoracic aortic atherosclerosis.   Electronically Signed By: David Martinique M.D. On: 11/11/2016 07:31           US Abdomen  Limited RUQ (Final result)  Result time 11/11/16 08:34:18  Final result by Marybelle Killings, MD (11/11/16 08:34:18)           Narrative:   CLINICAL DATA: Epigastric pain for 3 months  EXAM: US ABDOMEN LIMITED - RIGHT UPPER QUADRANT  COMPARISON: None.  FINDINGS: Gallbladder:  Multiple gallstones are noted. The largest measures 2.2 cm. There is no Murphy sign. There is no wall thickening or pericholecystic fluid. Other lobulated isoechoic structures are present within the lumen of the gallbladder without shadowing. This may represent a sludge ball, or nonshadowing calculus.  Common bile duct:  Diameter: 12 mm  Liver:  No focal lesion identified. Within normal limits in parenchymal echogenicity.  IMPRESSION: Cholelithiasis.  The common bile duct is dilated. Biliary obstruction is not excluded. Correlate with liver function tests as for the need for MRCP or ERCP.   Electronically Signed By: Marybelle Killings M.D. On: 11/11/2016 08:34           ____________________________________________   PROCEDURES  Procedure(s) performed:   Procedures  Critical Care performed:   ____________________________________________   INITIAL IMPRESSION / ASSESSMENT AND PLAN / ED COURSE  Pertinent labs & imaging results that were available during my care of the patient were reviewed by me and considered in my medical decision making (see chart for details).   Clinical Course    ----------------------------------------- 9:44 AM on 11/11/2016 -----------------------------------------  Patient without any pain at this time. Resting comfortably. Reassuring cardiac workup. Gallstones found on the ultrasound. Patient with likely reflux and now with new diagnosis of cholelithiasis. No evidence of cholecystitis. We discussed the lab as well as imaging results and the need for follow-up as an outpatient. The patient knows that she must follow-up with the surgeon as well as in the clinic with a primary care doctor. She is understanding of plan and willing to comply. We'll discharge with an antacid as well as sucralfate.  ____________________________________________   FINAL CLINICAL IMPRESSION(S) / ED DIAGNOSES  Final diagnoses:  Epigastric pain  Cholelithiasis   NEW MEDICATIONS STARTED DURING THIS VISIT:  New Prescriptions   No medications on file     Note:  This document was prepared using Dragon voice recognition software and may include unintentional dictation errors.    Orbie Pyo, MD 11/11/16 [phone]

## 2016-11-11 NOTE — ED Notes (Signed)
Patient transported to X-ray 

## 2016-11-19 ENCOUNTER — Inpatient Hospital Stay: Payer: Self-pay | Admitting: Surgery

## 2016-11-30 ENCOUNTER — Inpatient Hospital Stay: Payer: Self-pay | Admitting: Surgery

## 2016-12-07 ENCOUNTER — Ambulatory Visit (INDEPENDENT_AMBULATORY_CARE_PROVIDER_SITE_OTHER): Payer: Self-pay | Admitting: Surgery

## 2016-12-07 ENCOUNTER — Inpatient Hospital Stay: Payer: Self-pay | Admitting: Surgery

## 2016-12-07 ENCOUNTER — Encounter: Payer: Self-pay | Admitting: Surgery

## 2016-12-07 VITALS — BP 133/86 | HR 57 | Temp 97.8°F | Ht 64.0 in | Wt 115.8 lb

## 2016-12-07 DIAGNOSIS — R1011 Right upper quadrant pain: Secondary | ICD-10-CM

## 2016-12-07 NOTE — Patient Instructions (Addendum)
Your CT  scan is scheduled for 12/13/16 @ 3:45 pm at the Imaging center. [place] [id number]  We need for you to pick up your prep for the CT scan today.  We would like for you to stop by the Lab today. We will call you with the results later today.   Please see your follow up appointment listed below.

## 2016-12-07 NOTE — Progress Notes (Signed)
Patient ID: Cathy Sanchez, female   DOB: 22-Mar-1966, 51 y.o.   MRN: [id number]  HPI Cathy Sanchez is a 51 y.o. female recently seen in the emergency room for abdominal pain. She reports that at that time she was started having some right upper quadrant and epigastric pain. Pain was intermittent and was sharp in nature and mild in intensity. And as part of the workup a right upper quadrant ultrasound was performed and a half personally review these. There is evidence of common bile duct dilation To 12 mm in size. There is also evidence of multiple gallstones. No evidence of cholecystitis. At that time LFTs were normal as well as CBC. Of note she stated that she used to drink alcohol but not anymore. She does smoke 2 packs a day. No evidence of obstructive jaundice. She was given ranitidine and also Carafate with significant improvement of symptoms  HPI  Past Medical History:  Diagnosis Date  . Lung collapse     Past Surgical History:  Procedure Laterality Date  . ABDOMINAL HYSTERECTOMY    . collapse lung     patient doesn;t remember    Family History  Problem Relation Age of Onset  . Cancer Father     Social History Social History  Substance Use Topics  . Smoking status: Current Every Day Smoker    Packs/day: 2.00    Years: 30.00    Types: Cigarettes  . Smokeless tobacco: Never Used  . Alcohol use Yes    No Known Allergies  Current Outpatient Prescriptions  Medication Sig Dispense Refill  . ranitidine (ZANTAC) 150 MG tablet Take 1 tablet (150 mg total) by mouth 2 (two) times daily. 60 tablet 0  . sucralfate (CARAFATE) 1 g tablet Take 1 tablet (1 g total) by mouth 4 (four) times daily. 60 tablet 0   No current facility-administered medications for this visit.      Review of Systems A 10 point review of systems was asked and was negative except for the information on the HPI  Physical Exam Blood pressure 133/86, pulse (!) 57, temperature 97.8 F (36.6 C),  temperature source Oral, height 5\' 4"  (1.626 m), weight 52.5 kg (115 lb 12.8 oz). CONSTITUTIONAL: NAD EYES: Pupils are equal, round, and reactive to light, Sclera are non-icteric. EARS, NOSE, MOUTH AND THROAT: The oropharynx is clear. The oral mucosa is pink and moist. Hearing is intact to voice. LYMPH NODES:  Lymph nodes in the neck are normal. RESPIRATORY:  Lungs are clear. There is normal respiratory effort, with equal breath sounds bilaterally, and without pathologic use of accessory muscles. CARDIOVASCULAR: Heart is regular without murmurs, gallops, or rubs. GI: The abdomen is soft, nontender, and nondistended. There are no palpable masses. There is no hepatosplenomegaly. There are normal bowel sounds in all quadrants. GU: Rectal deferred.   MUSCULOSKELETAL: Normal muscle strength and tone. No cyanosis or edema.   SKIN: Turgor is good and there are no pathologic skin lesions or ulcers. NEUROLOGIC: Motor and sensation is grossly normal. Cranial nerves are grossly intact. PSYCH:  Oriented to person, place and time. Affect is normal.  Data Reviewed  I have personally reviewed the patient's imaging, laboratory findings and medical records.    Assessment Plan Questionable symptomatic cholelithiasis. More importantly am not sure why she does have a large common bile duct To 12 mm. I will like to interrogate her biliary system and a little bit more of her intra-abdominal pathology with a CT scan of the abdomen and pelvis to  rule out any other serious pathology including cancers in the head of the pancreas or periampullary region. No need for any emergent surgical revision at this time. There is no evidence of cholecystitis and there is no evidence of an acute into abdominal process at this time. Please note that I have discussed with the patient about my thought process and we'll see her back after his CT scan is back.  Caroleen Hamman, MD FACS General Surgeon 12/07/2016, 2:54 PM

## 2016-12-10 ENCOUNTER — Telehealth: Payer: Self-pay

## 2016-12-10 ENCOUNTER — Other Ambulatory Visit
Admission: RE | Admit: 2016-12-10 | Discharge: 2016-12-10 | Disposition: A | Discharge status: Home / Self Care | Payer: Self-pay | Source: Ambulatory Visit | Attending: Surgery | Admitting: Surgery

## 2016-12-10 DIAGNOSIS — R1011 Right upper quadrant pain: Secondary | ICD-10-CM | POA: Insufficient documentation

## 2016-12-10 LAB — CBC WITH DIFFERENTIAL/PLATELET
Basophils Absolute: 0.2 10*3/uL — ABNORMAL HIGH (ref 0–0.1)
Basophils Relative: 3 %
EOS PCT: 2 %
Eosinophils Absolute: 0.1 10*3/uL (ref 0–0.7)
HEMATOCRIT: 33.2 % — AB (ref 35.0–47.0)
Hemoglobin: 11 g/dL — ABNORMAL LOW (ref 12.0–16.0)
LYMPHS ABS: 2.4 10*3/uL (ref 1.0–3.6)
LYMPHS PCT: 40 %
MCH: 23.7 pg — AB (ref 26.0–34.0)
MCHC: 33.2 g/dL (ref 32.0–36.0)
MCV: 71.5 fL — AB (ref 80.0–100.0)
MONO ABS: 0.3 10*3/uL (ref 0.2–0.9)
MONOS PCT: 6 %
NEUTROS ABS: 3 10*3/uL (ref 1.4–6.5)
Neutrophils Relative %: 49 %
PLATELETS: 240 10*3/uL (ref 150–440)
RBC: 4.64 MIL/uL (ref 3.80–5.20)
RDW: 14.3 % (ref 11.5–14.5)
WBC: 6 10*3/uL (ref 3.6–11.0)

## 2016-12-10 LAB — LIPASE, BLOOD: Lipase: 30 U/L (ref 11–51)

## 2016-12-10 LAB — COMPREHENSIVE METABOLIC PANEL
ALBUMIN: 3.9 g/dL (ref 3.5–5.0)
ALT: 37 U/L (ref 14–54)
AST: 24 U/L (ref 15–41)
Alkaline Phosphatase: 75 U/L (ref 38–126)
Anion gap: 4 — ABNORMAL LOW (ref 5–15)
BUN: 15 mg/dL (ref 6–20)
CHLORIDE: 104 mmol/L (ref 101–111)
CO2: 31 mmol/L (ref 22–32)
CREATININE: 0.59 mg/dL (ref 0.44–1.00)
Calcium: 8.9 mg/dL (ref 8.9–10.3)
GFR calc Af Amer: >60 mL/min (ref >60)
GLUCOSE: 74 mg/dL (ref 65–99)
POTASSIUM: 3.9 mmol/L (ref 3.5–5.1)
Sodium: 139 mmol/L (ref 135–145)
Total Bilirubin: 0.3 mg/dL (ref 0.3–1.2)
Total Protein: 7.2 g/dL (ref 6.5–8.1)

## 2016-12-10 NOTE — Telephone Encounter (Signed)
Spoke to patient at this time. Patient did not go to Lab after her visit 12/08/16. I asked if she could come today due to her CT scan scheduled  on Monday and she stated she would try. Patient was encouraged to come to Lab so we would not have to cancel CT  scan.

## 2016-12-10 NOTE — Telephone Encounter (Signed)
Left Message for patient to call office regarding lab work.

## 2016-12-13 ENCOUNTER — Ambulatory Visit
Admission: RE | Admit: 2016-12-13 | Discharge: 2016-12-13 | Disposition: A | Discharge status: Home / Self Care | Payer: Self-pay | Source: Ambulatory Visit | Attending: Surgery | Admitting: Surgery

## 2016-12-13 DIAGNOSIS — R1011 Right upper quadrant pain: Secondary | ICD-10-CM

## 2016-12-13 DIAGNOSIS — K8020 Calculus of gallbladder without cholecystitis without obstruction: Secondary | ICD-10-CM | POA: Insufficient documentation

## 2016-12-13 MED ORDER — IOPAMIDOL (ISOVUE-300) INJECTION 61%
85.0000 mL | Freq: Once | INTRAVENOUS | Status: AC | PRN
  Administered 2016-12-13: 85 mL via INTRAVENOUS

## 2016-12-30 ENCOUNTER — Ambulatory Visit: Payer: Self-pay | Admitting: Surgery

## 2017-01-10 ENCOUNTER — Ambulatory Visit (INDEPENDENT_AMBULATORY_CARE_PROVIDER_SITE_OTHER): Payer: Self-pay | Admitting: Surgery

## 2017-01-10 ENCOUNTER — Encounter: Payer: Self-pay | Admitting: Surgery

## 2017-01-10 ENCOUNTER — Other Ambulatory Visit
Admission: RE | Admit: 2017-01-10 | Discharge: 2017-01-10 | Disposition: A | Discharge status: Home / Self Care | Payer: Self-pay | Source: Ambulatory Visit | Attending: Surgery | Admitting: Surgery

## 2017-01-10 VITALS — BP 102/63 | HR 71 | Temp 97.9°F | Ht 64.0 in | Wt 118.0 lb

## 2017-01-10 DIAGNOSIS — K8020 Calculus of gallbladder without cholecystitis without obstruction: Secondary | ICD-10-CM

## 2017-01-10 DIAGNOSIS — Z01818 Encounter for other preprocedural examination: Secondary | ICD-10-CM

## 2017-01-10 LAB — CBC WITH DIFFERENTIAL/PLATELET
Basophils Absolute: 0.1 10*3/uL (ref 0–0.1)
Basophils Relative: 1 %
Eosinophils Absolute: 0.1 10*3/uL (ref 0–0.7)
Eosinophils Relative: 2 %
HEMATOCRIT: 35.3 % (ref 35.0–47.0)
Hemoglobin: 11.3 g/dL — ABNORMAL LOW (ref 12.0–16.0)
LYMPHS ABS: 2.7 10*3/uL (ref 1.0–3.6)
LYMPHS PCT: 34 %
MCH: 22.9 pg — AB (ref 26.0–34.0)
MCHC: 31.9 g/dL — AB (ref 32.0–36.0)
MCV: 71.6 fL — AB (ref 80.0–100.0)
Monocytes Absolute: 0.4 10*3/uL (ref 0.2–0.9)
Monocytes Relative: 5 %
NEUTROS ABS: 4.7 10*3/uL (ref 1.4–6.5)
Neutrophils Relative %: 58 %
Platelets: 231 10*3/uL (ref 150–440)
RBC: 4.94 MIL/uL (ref 3.80–5.20)
RDW: 14.5 % (ref 11.5–14.5)
WBC: 8 10*3/uL (ref 3.6–11.0)

## 2017-01-10 LAB — COMPREHENSIVE METABOLIC PANEL
ALBUMIN: 3.8 g/dL (ref 3.5–5.0)
ALT: 15 U/L (ref 14–54)
ANION GAP: 8 (ref 5–15)
AST: 19 U/L (ref 15–41)
Alkaline Phosphatase: 69 U/L (ref 38–126)
BUN: 16 mg/dL (ref 6–20)
CHLORIDE: 110 mmol/L (ref 101–111)
CO2: 27 mmol/L (ref 22–32)
Calcium: 9 mg/dL (ref 8.9–10.3)
Creatinine, Ser: 0.57 mg/dL (ref 0.44–1.00)
GFR calc non Af Amer: >60 mL/min (ref >60)
GLUCOSE: 102 mg/dL — AB (ref 65–99)
Potassium: 3.4 mmol/L — ABNORMAL LOW (ref 3.5–5.1)
SODIUM: 145 mmol/L (ref 135–145)
Total Bilirubin: 0.2 mg/dL — ABNORMAL LOW (ref 0.3–1.2)
Total Protein: 7.3 g/dL (ref 6.5–8.1)

## 2017-01-10 LAB — ETHANOL: Alcohol, Ethyl (B): 62 mg/dL — ABNORMAL HIGH (ref <5)

## 2017-01-10 NOTE — Patient Instructions (Signed)
We are sending you to the [facility] today to have your pre-op Labs drawn. We have your surgery scheduled for 01/26/17. Please see your blue pre-op sheet for surgery information. Please call our office if you have any questions or concerns.  Directions to Medical Mall: When leaving our office, go right. Go all of the way down to the very end of the hallway. You will have a purple wall in front of you. You will now have a tunnel to the hospital on your left hand side. Go through this tunnel and the elevators will be on your left. Go down to the 1st floor and take a slight left. The very first desk on the right hand side is the registration desk.

## 2017-01-11 ENCOUNTER — Telehealth: Payer: Self-pay

## 2017-01-11 NOTE — Progress Notes (Signed)
Patient ID: Cathy Sanchez, female   DOB: 05/30/1966, 51 y.o.   MRN: [id number]  HPI Cathy Sanchez is a 51 y.o. female all non-to me with a history of abdominal pain for a few months. She reports persistent intermittent right upper quadrant and epigastric pain. She recently underwent a CT scan of the abdomen and pelvis I given that there was dilation on her CBD from a previous ultrasound. And the CT scan I have personally reviewed and is pretty much unremarkable. There is no evidence of common bile duct malignancy is, feeling defects or pancreatic tumors. No evidence of intrahepatic dilation. Normal pancreas. He continues to have epigastric and right upper quadrant pain and so far the only positive finding has been gallstones. She does drink and but her LFTs are completely normal with a normal albumin and a normal creatinine.  HPI  Past Medical History:  Diagnosis Date  . Lung collapse     Past Surgical History:  Procedure Laterality Date  . ABDOMINAL HYSTERECTOMY    . collapse lung     patient doesn;t remember    Family History  Problem Relation Age of Onset  . Cancer Father     Social History Social History  Substance Use Topics  . Smoking status: Current Every Day Smoker    Packs/day: 2.00    Years: 30.00    Types: Cigarettes  . Smokeless tobacco: Never Used  . Alcohol use Yes    No Known Allergies  Current Outpatient Prescriptions  Medication Sig Dispense Refill  . ranitidine (ZANTAC) 150 MG tablet Take 1 tablet (150 mg total) by mouth 2 (two) times daily. 60 tablet 0  . sucralfate (CARAFATE) 1 g tablet Take 1 tablet (1 g total) by mouth 4 (four) times daily. 60 tablet 0   No current facility-administered medications for this visit.      Review of Systems A 10 point review of systems was asked and was negative except for the information on the HPI  Physical Exam Blood pressure 102/63, pulse 71, temperature 97.9 F (36.6 C), temperature source Oral, height 5' 4"  (1.626 m), weight 53.5 kg (118 lb). CONSTITUTIONAL: NAD EYES: Pupils are equal, round, and reactive to light, Sclera are non-icteric. EARS, NOSE, MOUTH AND THROAT: The oropharynx is clear. The oral mucosa is pink and moist. Hearing is intact to voice. LYMPH NODES:  Lymph nodes in the neck are normal. RESPIRATORY:  Lungs are clear. There is normal respiratory effort, with equal breath sounds bilaterally, and without pathologic use of accessory muscles. CARDIOVASCULAR: Heart is regular without murmurs, gallops, or rubs. GI: The abdomen is soft,Mild tender to palpation in the epigastric area and right upper quadrant. No peritonitis GU: Rectal deferred.   MUSCULOSKELETAL: Normal muscle strength and tone. No cyanosis or edema.   SKIN: Turgor is good and there are no pathologic skin lesions or ulcers. NEUROLOGIC: Motor and sensation is grossly normal. Cranial nerves are grossly intact. PSYCH:  Oriented to person, place and time. Affect is normal.  Data Reviewed  I have personally reviewed the patient's imaging, laboratory findings and medical records.    Assessment/ Plan Abdominal pain from presumed symptomatic cholelithiasis. Discussed with the patient in detail about the possibility that the gallstones might be causing her symptoms. She does have a history of ethanol abuse and I will order some polysubstance abuse panel as well as repeat CBC and a CMP including an ethanol level. An extensive discussion with patient regarding options with further workup by GI for upper   and lower GI versus proceeding to the operating room for cholecystectomy. Her symptoms can deftly be explained by symptomatic cholelithiasis. After clear discussion with the patient that even a cholecystectomy may not alleviate her symptoms she wishes to go ahead and have her gallbladder removed. She is in agreement that if she has persistent symptoms may need to have an additional endoscopic workup. The risks, benefits, complications,  treatment options, and expected outcomes were discussed with the patient. The possibilities of bleeding, recurrent infection, finding a normal gallbladder, perforation of viscus organs, damage to surrounding structures, bile leak, abscess formation, needing a drain placed, the need for additional procedures, reaction to medication, pulmonary aspiration,  failure to diagnose a condition, the possible need to convert to an open procedure, and creating a complication requiring transfusion or operation were discussed with the patient. The patient concurred with the proposed plan, giving informed consent.  Obviously if there is any substance on more we'll have to cancel the procedure. We will tentatively schedule for elective cholecystectomy as an outpatient. Plan lap chole w IOC  Kittie Krizan, MD FACS General Surgeon 01/11/2017, 1:01 PM   

## 2017-01-11 NOTE — Telephone Encounter (Signed)
No insurance listed in chart.  Patient is self pay for CPT code [phone].

## 2017-01-11 NOTE — Telephone Encounter (Signed)
Patient has been advised of Surgery Date as well as Pre-Admission appointment date, time, and location.  Surgery Date: 01/26/17  Pre-admit Appointment: 01/19/17 from 9a-1p (Phone)  Patient has been advised to call [phone] the day before surgery between 1-3pm to obtain arrival time.

## 2017-01-19 ENCOUNTER — Telehealth: Payer: Self-pay

## 2017-01-19 ENCOUNTER — Other Ambulatory Visit: Payer: Self-pay

## 2017-01-19 ENCOUNTER — Encounter
Admission: RE | Admit: 2017-01-19 | Discharge: 2017-01-19 | Disposition: A | Discharge status: Home / Self Care | Payer: Self-pay | Source: Ambulatory Visit | Attending: Surgery | Admitting: Surgery

## 2017-01-19 DIAGNOSIS — K8020 Calculus of gallbladder without cholecystitis without obstruction: Secondary | ICD-10-CM

## 2017-01-19 HISTORY — DX: Gastro-esophageal reflux disease without esophagitis: K21.9

## 2017-01-19 HISTORY — DX: Anemia, unspecified: D64.9

## 2017-01-19 NOTE — Patient Instructions (Signed)
  Your procedure is scheduled on: 01-26-17 Report to Same Day Surgery 2nd floor medical mall [facility] Entrance-take elevator on left to 2nd floor.  Check in with surgery information desk.) To find out your arrival time please call [phone] between 1PM - 3PM on 01-25-17  Remember: Instructions that are not followed completely may result in serious medical risk, up to and including death, or upon the discretion of your surgeon and anesthesiologist your surgery may need to be rescheduled.    _x___ 1. Do not eat food or drink liquids after midnight. No gum chewing or hard candies.     __x__ 2. No Alcohol for 24 hours before or after surgery.   __x__3. No Smoking for 24 prior to surgery.   ____  4. Bring all medications with you on the day of surgery if instructed.    __x__ 5. Notify your doctor if there is any change in your medical condition     (cold, fever, infections).     Do not wear jewelry, make-up, hairpins, clips or nail polish.  Do not wear lotions, powders, or perfumes. You may wear deodorant.  Do not shave 48 hours prior to surgery. Men may shave face and neck.  Do not bring valuables to the hospital.    [facility] is not responsible for any belongings or valuables.               Contacts, dentures or bridgework may not be worn into surgery.  Leave your suitcase in the car. After surgery it may be brought to your room.  For patients admitted to the hospital, discharge time is determined by your  treatment team.   Patients discharged the day of surgery will not be allowed to drive home.  You will need someone to drive you home and stay with you the night of your procedure.    Please read over the following fact sheets that you were given:   [facility] Preparing for Surgery and or MRSA Information   _x___ Take anti-hypertensive (unless it includes a diuretic), cardiac, seizure, asthma, anti-reflux and psychiatric medicines. These include:  1.  NONE  2.  3.  4.  5.  6.  ____Fleets enema or Magnesium Citrate as directed.   ____ Use CHG Soap or sage wipes as directed on instruction sheet   ____ Use inhalers on the day of surgery and bring to hospital day of surgery  ____ Stop Metformin and Janumet 2 days prior to surgery.    ____ Take 1/2 of usual insulin dose the night before surgery and none on the morning     surgery.   ____ Follow recommendations from Cardiologist, Pulmonologist or PCP regarding stopping Aspirin, Coumadin, Pllavix ,Eliquis, Effient, or Pradaxa, and Pletal.  X____Stop Anti-inflammatories such as Advil, Aleve, Ibuprofen, Motrin, NAPROXEN, Naprosyn, Goodies powders or aspirin products NOW-OK to take Tylenol    ____ Stop supplements until after surgery.  But may continue Vitamin D, Vitamin B,       and multivitamin.   ____ Bring C-Pap to the hospital.

## 2017-01-19 NOTE — Telephone Encounter (Signed)
Surgical Orders placed at this time for this patient.

## 2017-01-19 NOTE — Telephone Encounter (Signed)
Patient is having surgery on 01/26/17 with Dr. Dahlia Byes. Please put in the orders for this patient.

## 2017-01-24 LAB — THC,MS,WB/SP RFX
CANNABIDIOL: NEGATIVE ng/mL
CANNABINOL: NEGATIVE ng/mL
Cannabinoid Confirmation: POSITIVE
Carboxy-THC: 8.5 ng/mL
Hydroxy-THC: NEGATIVE ng/mL
Tetrahydrocannabinol(THC): NEGATIVE ng/mL

## 2017-01-24 LAB — DRUG SCREEN 10 W/CONF, SERUM
AMPHETAMINES, IA: NEGATIVE ng/mL
Barbiturates, IA: NEGATIVE ug/mL
Benzodiazepines, IA: NEGATIVE ng/mL
Cocaine & Metabolite, IA: NEGATIVE ng/mL
METHADONE, IA: NEGATIVE ng/mL
OXYCODONES, IA: NEGATIVE ng/mL
Opiates, IA: NEGATIVE ng/mL
PROPOXYPHENE, IA: NEGATIVE ng/mL
Phencyclidine, IA: NEGATIVE ng/mL
THC(Marijuana) Metabolite, IA: POSITIVE ng/mL

## 2017-01-25 MED ORDER — CEFAZOLIN SODIUM-DEXTROSE 2-4 GM/100ML-% IV SOLN
2.0000 g | INTRAVENOUS | Status: AC
  Administered 2017-01-26: 2 g via INTRAVENOUS

## 2017-01-26 ENCOUNTER — Ambulatory Visit: Payer: Self-pay

## 2017-01-26 ENCOUNTER — Ambulatory Visit: Payer: Self-pay | Admitting: Anesthesiology

## 2017-01-26 ENCOUNTER — Encounter: Payer: Self-pay | Admitting: *Deleted

## 2017-01-26 ENCOUNTER — Ambulatory Visit
Admission: RE | Admit: 2017-01-26 | Discharge: 2017-01-26 | Disposition: A | Discharge status: Home / Self Care | Payer: Self-pay | Source: Ambulatory Visit | Attending: Surgery | Admitting: Surgery

## 2017-01-26 ENCOUNTER — Encounter
Admission: RE | Disposition: A | Discharge status: Home / Self Care | Payer: Self-pay | Source: Ambulatory Visit | Attending: Surgery

## 2017-01-26 DIAGNOSIS — K811 Chronic cholecystitis: Secondary | ICD-10-CM | POA: Insufficient documentation

## 2017-01-26 DIAGNOSIS — D135 Benign neoplasm of extrahepatic bile ducts: Secondary | ICD-10-CM | POA: Insufficient documentation

## 2017-01-26 DIAGNOSIS — K8020 Calculus of gallbladder without cholecystitis without obstruction: Secondary | ICD-10-CM

## 2017-01-26 DIAGNOSIS — Z419 Encounter for procedure for purposes other than remedying health state, unspecified: Secondary | ICD-10-CM

## 2017-01-26 DIAGNOSIS — F17210 Nicotine dependence, cigarettes, uncomplicated: Secondary | ICD-10-CM | POA: Insufficient documentation

## 2017-01-26 HISTORY — PX: CHOLECYSTECTOMY: SHX55

## 2017-01-26 LAB — URINE DRUG SCREEN, QUALITATIVE (ARMC ONLY)
AMPHETAMINES, UR SCREEN: NOT DETECTED
Barbiturates, Ur Screen: NOT DETECTED
Benzodiazepine, Ur Scrn: NOT DETECTED
CANNABINOID 50 NG, UR ~~LOC~~: POSITIVE — AB
COCAINE METABOLITE, UR ~~LOC~~: NOT DETECTED
MDMA (ECSTASY) UR SCREEN: NOT DETECTED
METHADONE SCREEN, URINE: NOT DETECTED
Opiate, Ur Screen: NOT DETECTED
Phencyclidine (PCP) Ur S: NOT DETECTED
TRICYCLIC, UR SCREEN: NOT DETECTED

## 2017-01-26 SURGERY — LAPAROSCOPIC CHOLECYSTECTOMY WITH INTRAOPERATIVE CHOLANGIOGRAM
Anesthesia: General | Wound class: Clean Contaminated

## 2017-01-26 MED ORDER — BUPIVACAINE HCL (PF) 0.25 % IJ SOLN
INTRAMUSCULAR | Status: AC
  Filled 2017-01-26: qty 30

## 2017-01-26 MED ORDER — CHLORHEXIDINE GLUCONATE CLOTH 2 % EX PADS: 6.0000 | MEDICATED_PAD | Freq: Once | CUTANEOUS | Status: DC

## 2017-01-26 MED ORDER — LIDOCAINE HCL (PF) 2 % IJ SOLN
INTRAMUSCULAR | Status: AC
  Filled 2017-01-26: qty 2

## 2017-01-26 MED ORDER — FENTANYL CITRATE (PF) 100 MCG/2ML IJ SOLN
25.0000 ug | INTRAMUSCULAR | Status: DC | PRN
  Administered 2017-01-26 (×4): 25 ug via INTRAVENOUS

## 2017-01-26 MED ORDER — LACTATED RINGERS IV SOLN
INTRAVENOUS | Status: DC
  Administered 2017-01-26: 12:00:00 via INTRAVENOUS

## 2017-01-26 MED ORDER — SUCCINYLCHOLINE CHLORIDE 20 MG/ML IJ SOLN
INTRAMUSCULAR | Status: AC
  Filled 2017-01-26: qty 1

## 2017-01-26 MED ORDER — IOPAMIDOL (ISOVUE-300) INJECTION 61%
INTRAVENOUS | Status: DC | PRN
  Administered 2017-01-26: 10 mL via INTRAVENOUS

## 2017-01-26 MED ORDER — FENTANYL CITRATE (PF) 100 MCG/2ML IJ SOLN
INTRAMUSCULAR | Status: AC
  Administered 2017-01-26: 25 ug via INTRAVENOUS
  Filled 2017-01-26: qty 2

## 2017-01-26 MED ORDER — MEPERIDINE HCL 50 MG/ML IJ SOLN: 6.2500 mg | INTRAMUSCULAR | Status: DC | PRN

## 2017-01-26 MED ORDER — CEFAZOLIN SODIUM-DEXTROSE 2-4 GM/100ML-% IV SOLN
INTRAVENOUS | Status: AC
  Filled 2017-01-26: qty 100

## 2017-01-26 MED ORDER — KETOROLAC TROMETHAMINE 30 MG/ML IJ SOLN
INTRAMUSCULAR | Status: AC
  Filled 2017-01-26: qty 1

## 2017-01-26 MED ORDER — SUGAMMADEX SODIUM 200 MG/2ML IV SOLN
INTRAVENOUS | Status: DC | PRN
  Administered 2017-01-26: 100 mg via INTRAVENOUS

## 2017-01-26 MED ORDER — OXYCODONE HCL 5 MG PO TABS
ORAL_TABLET | ORAL | Status: AC
  Filled 2017-01-26: qty 1

## 2017-01-26 MED ORDER — KETOROLAC TROMETHAMINE 30 MG/ML IJ SOLN
INTRAMUSCULAR | Status: DC | PRN
  Administered 2017-01-26: 30 mg via INTRAVENOUS

## 2017-01-26 MED ORDER — OXYCODONE HCL 5 MG/5ML PO SOLN: 5.0000 mg | Freq: Once | ORAL | Status: AC | PRN

## 2017-01-26 MED ORDER — FENTANYL CITRATE (PF) 100 MCG/2ML IJ SOLN
INTRAMUSCULAR | Status: DC | PRN
  Administered 2017-01-26: 100 ug via INTRAVENOUS

## 2017-01-26 MED ORDER — HYDROCODONE-ACETAMINOPHEN 5-325 MG PO TABS: 1.0000 | ORAL_TABLET | Freq: Four times a day (QID) | ORAL | 0 refills | Status: DC | PRN

## 2017-01-26 MED ORDER — DEXAMETHASONE SODIUM PHOSPHATE 10 MG/ML IJ SOLN
INTRAMUSCULAR | Status: DC | PRN
  Administered 2017-01-26: 10 mg via INTRAVENOUS

## 2017-01-26 MED ORDER — PHENYLEPHRINE HCL 10 MG/ML IJ SOLN
INTRAMUSCULAR | Status: DC | PRN
  Administered 2017-01-26: 50 ug via INTRAVENOUS
  Administered 2017-01-26: 100 ug via INTRAVENOUS
  Administered 2017-01-26 (×3): 50 ug via INTRAVENOUS

## 2017-01-26 MED ORDER — MIDAZOLAM HCL 5 MG/5ML IJ SOLN
INTRAMUSCULAR | Status: DC | PRN
  Administered 2017-01-26: 2 mg via INTRAVENOUS

## 2017-01-26 MED ORDER — SUGAMMADEX SODIUM 200 MG/2ML IV SOLN
INTRAVENOUS | Status: AC
  Filled 2017-01-26: qty 2

## 2017-01-26 MED ORDER — PROPOFOL 10 MG/ML IV BOLUS
INTRAVENOUS | Status: AC
  Filled 2017-01-26: qty 20

## 2017-01-26 MED ORDER — ACETAMINOPHEN 10 MG/ML IV SOLN
INTRAVENOUS | Status: AC
  Filled 2017-01-26: qty 100

## 2017-01-26 MED ORDER — LIDOCAINE HCL (CARDIAC) 20 MG/ML IV SOLN
INTRAVENOUS | Status: DC | PRN
  Administered 2017-01-26: 60 mg via INTRAVENOUS

## 2017-01-26 MED ORDER — FENTANYL CITRATE (PF) 100 MCG/2ML IJ SOLN
INTRAMUSCULAR | Status: AC
  Filled 2017-01-26: qty 2

## 2017-01-26 MED ORDER — BUPIVACAINE HCL (PF) 0.25 % IJ SOLN
INTRAMUSCULAR | Status: DC | PRN
  Administered 2017-01-26: 30 mL

## 2017-01-26 MED ORDER — PROPOFOL 10 MG/ML IV BOLUS
INTRAVENOUS | Status: DC | PRN
  Administered 2017-01-26: 130 mg via INTRAVENOUS

## 2017-01-26 MED ORDER — ROCURONIUM BROMIDE 50 MG/5ML IV SOLN
INTRAVENOUS | Status: AC
  Filled 2017-01-26: qty 1

## 2017-01-26 MED ORDER — FAMOTIDINE 20 MG PO TABS
20.0000 mg | ORAL_TABLET | Freq: Once | ORAL | Status: AC
  Administered 2017-01-26: 20 mg via ORAL

## 2017-01-26 MED ORDER — ONDANSETRON HCL 4 MG/2ML IJ SOLN
INTRAMUSCULAR | Status: AC
  Filled 2017-01-26: qty 2

## 2017-01-26 MED ORDER — DEXAMETHASONE SODIUM PHOSPHATE 10 MG/ML IJ SOLN
INTRAMUSCULAR | Status: AC
  Filled 2017-01-26: qty 1

## 2017-01-26 MED ORDER — SUCCINYLCHOLINE CHLORIDE 20 MG/ML IJ SOLN
INTRAMUSCULAR | Status: DC | PRN
  Administered 2017-01-26: 100 mg via INTRAVENOUS

## 2017-01-26 MED ORDER — ROCURONIUM BROMIDE 100 MG/10ML IV SOLN
INTRAVENOUS | Status: DC | PRN
  Administered 2017-01-26: 5 mg via INTRAVENOUS
  Administered 2017-01-26: 20 mg via INTRAVENOUS

## 2017-01-26 MED ORDER — MIDAZOLAM HCL 2 MG/2ML IJ SOLN
INTRAMUSCULAR | Status: AC
  Filled 2017-01-26: qty 2

## 2017-01-26 MED ORDER — ONDANSETRON HCL 4 MG/2ML IJ SOLN
INTRAMUSCULAR | Status: DC | PRN
  Administered 2017-01-26: 4 mg via INTRAVENOUS

## 2017-01-26 MED ORDER — ACETAMINOPHEN 10 MG/ML IV SOLN
INTRAVENOUS | Status: DC | PRN
  Administered 2017-01-26: 1000 mg via INTRAVENOUS

## 2017-01-26 MED ORDER — SODIUM CHLORIDE 0.9 % IJ SOLN
INTRAMUSCULAR | Status: AC
  Filled 2017-01-26: qty 50

## 2017-01-26 MED ORDER — FAMOTIDINE 20 MG PO TABS
ORAL_TABLET | ORAL | Status: AC
  Administered 2017-01-26: 20 mg via ORAL
  Filled 2017-01-26: qty 1

## 2017-01-26 MED ORDER — PROMETHAZINE HCL 25 MG/ML IJ SOLN: 6.2500 mg | INTRAMUSCULAR | Status: DC | PRN

## 2017-01-26 MED ORDER — CHLORHEXIDINE GLUCONATE CLOTH 2 % EX PADS
6.0000 | MEDICATED_PAD | Freq: Once | CUTANEOUS | Status: AC
  Administered 2017-01-26: 6 via TOPICAL

## 2017-01-26 MED ORDER — OXYCODONE HCL 5 MG PO TABS
5.0000 mg | ORAL_TABLET | Freq: Once | ORAL | Status: AC | PRN
  Administered 2017-01-26: 5 mg via ORAL

## 2017-01-26 SURGICAL SUPPLY — 48 items
APPLICATOR COTTON TIP 6IN STRL (MISCELLANEOUS) ×4 IMPLANT
APPLIER CLIP 5 13 M/L LIGAMAX5 (MISCELLANEOUS) ×4
BLADE SURG 15 STRL LF DISP TIS (BLADE) ×2 IMPLANT
BLADE SURG 15 STRL SS (BLADE) ×2
CANISTER SUCT 1200ML W/VALVE (MISCELLANEOUS) ×4 IMPLANT
CHLORAPREP W/TINT 26ML (MISCELLANEOUS) ×4 IMPLANT
CHOLANGIOGRAM CATH TAUT (CATHETERS) ×4 IMPLANT
CLEANER CAUTERY TIP 5X5 PAD (MISCELLANEOUS) ×2 IMPLANT
CLIP APPLIE 5 13 M/L LIGAMAX5 (MISCELLANEOUS) ×2 IMPLANT
DECANTER SPIKE VIAL GLASS SM (MISCELLANEOUS) ×8 IMPLANT
DERMABOND ADVANCED (GAUZE/BANDAGES/DRESSINGS) ×2
DERMABOND ADVANCED .7 DNX12 (GAUZE/BANDAGES/DRESSINGS) ×2 IMPLANT
DEVICE TROCAR PUNCTURE CLOSURE (ENDOMECHANICALS) IMPLANT
DRAPE C-ARM XRAY 36X54 (DRAPES) ×4 IMPLANT
ELECT CAUTERY BLADE 6.4 (BLADE) ×4 IMPLANT
ELECT REM PT RETURN 9FT ADLT (ELECTROSURGICAL) ×4
ELECTRODE REM PT RTRN 9FT ADLT (ELECTROSURGICAL) ×2 IMPLANT
ENDOPOUCH RETRIEVER 10 (MISCELLANEOUS) ×4 IMPLANT
GLOVE BIO SURGEON STRL SZ7 (GLOVE) ×4 IMPLANT
GOWN STRL REUS W/ TWL LRG LVL3 (GOWN DISPOSABLE) ×6 IMPLANT
GOWN STRL REUS W/TWL LRG LVL3 (GOWN DISPOSABLE) ×6
IRRIGATION STRYKERFLOW (MISCELLANEOUS) ×2 IMPLANT
IRRIGATOR STRYKERFLOW (MISCELLANEOUS) ×4
IV CATH ANGIO 12GX3 LT BLUE (NEEDLE) ×4 IMPLANT
IV NS 1000ML (IV SOLUTION) ×2
IV NS 1000ML BAXH (IV SOLUTION) ×2 IMPLANT
L-HOOK LAP DISP 36CM (ELECTROSURGICAL) ×4
LHOOK LAP DISP 36CM (ELECTROSURGICAL) ×2 IMPLANT
LIQUID BAND (GAUZE/BANDAGES/DRESSINGS) IMPLANT
NEEDLE HYPO 22GX1.5 SAFETY (NEEDLE) ×4 IMPLANT
PACK LAP CHOLECYSTECTOMY (MISCELLANEOUS) ×4 IMPLANT
PAD CLEANER CAUTERY TIP 5X5 (MISCELLANEOUS) ×2
PENCIL ELECTRO HAND CTR (MISCELLANEOUS) ×4 IMPLANT
SCISSORS METZENBAUM CVD 33 (INSTRUMENTS) ×4 IMPLANT
SLEEVE ENDOPATH XCEL 5M (ENDOMECHANICALS) ×8 IMPLANT
SOL ANTI-FOG 6CC FOG-OUT (MISCELLANEOUS) ×2 IMPLANT
SOL FOG-OUT ANTI-FOG 6CC (MISCELLANEOUS) ×2
SPONGE LAP 18X18 5 PK (GAUZE/BANDAGES/DRESSINGS) ×4 IMPLANT
STOPCOCK 3 WAY  REPLAC (MISCELLANEOUS) IMPLANT
SUT ETHIBOND 0 MO6 C/R (SUTURE) IMPLANT
SUT MNCRL AB 4-0 PS2 18 (SUTURE) ×4 IMPLANT
SUT VIC AB 0 CT2 27 (SUTURE) IMPLANT
SUT VICRYL 0 AB UR-6 (SUTURE) ×8 IMPLANT
SYR 20CC LL (SYRINGE) ×12 IMPLANT
TROCAR XCEL BLUNT TIP 100MML (ENDOMECHANICALS) ×4 IMPLANT
TROCAR XCEL NON-BLD 5MMX100MML (ENDOMECHANICALS) ×4 IMPLANT
TUBING INSUFFLATOR HI FLOW (MISCELLANEOUS) ×4 IMPLANT
WATER STERILE IRR 1000ML POUR (IV SOLUTION) ×4 IMPLANT

## 2017-01-26 NOTE — Anesthesia Procedure Notes (Signed)
Procedure Name: Intubation Date/Time: 01/26/2017 1:03 PM Performed by: Dionne Bucy Pre-anesthesia Checklist: Patient identified, Patient being monitored, Timeout performed, Emergency Drugs available and Suction available Patient Re-evaluated:Patient Re-evaluated prior to inductionOxygen Delivery Method: Circle system utilized Preoxygenation: Pre-oxygenation with 100% oxygen Intubation Type: IV induction Ventilation: Mask ventilation without difficulty Laryngoscope Size: Mac and 3 Grade View: Grade I Tube type: Oral Tube size: 7.0 mm Number of attempts: 1 Airway Equipment and Method: Stylet Placement Confirmation: ETT inserted through vocal cords under direct vision,  positive ETCO2 and breath sounds checked- equal and bilateral Secured at: 21 cm Tube secured with: Tape Dental Injury: Teeth and Oropharynx as per pre-operative assessment

## 2017-01-26 NOTE — Anesthesia Preprocedure Evaluation (Signed)
Anesthesia Evaluation  Patient identified by MRN, date of birth, ID band Patient awake    Reviewed: Allergy & Precautions, NPO status , Patient's Chart, lab work & pertinent test results  History of Anesthesia Complications Negative for: history of anesthetic complications  Airway Mallampati: II  TM Distance: >3 FB Neck ROM: Full    Dental  (+) Poor Dentition, Missing   Pulmonary neg sleep apnea, neg COPD, Current Smoker,    breath sounds clear to auscultation- rhonchi (-) wheezing      Cardiovascular Exercise Tolerance: Good (-) hypertension(-) CAD and (-) Past MI  Rhythm:Regular Rate:Normal - Systolic murmurs and - Diastolic murmurs    Neuro/Psych negative neurological ROS  negative psych ROS   GI/Hepatic Neg liver ROS, GERD  ,  Endo/Other  negative endocrine ROSneg diabetes  Renal/GU negative Renal ROS     Musculoskeletal negative musculoskeletal ROS (+)   Abdominal (+) - obese,   Peds  Hematology  (+) anemia ,   Anesthesia Other Findings Past Medical History: No date: Anemia     Comment: h/o No date: GERD (gastroesophageal reflux disease)     Comment: no meds-pt states she has no problems with               gerd but has been told she has gerd by pcp No date: Lung collapse     Comment: right-1st happened during pregnancy when she               coughed and then happened again in 2016   Reproductive/Obstetrics                             Anesthesia Physical Anesthesia Plan  ASA: II  Anesthesia Plan: General   Post-op Pain Management:    Induction: Intravenous  Airway Management Planned: Oral ETT  Additional Equipment:   Intra-op Plan:   Post-operative Plan: Extubation in OR  Informed Consent: I have reviewed the patients History and Physical, chart, labs and discussed the procedure including the risks, benefits and alternatives for the proposed anesthesia with the  patient or authorized representative who has indicated his/her understanding and acceptance.   Dental advisory given  Plan Discussed with: CRNA and Anesthesiologist  Anesthesia Plan Comments:         Anesthesia Quick Evaluation

## 2017-01-26 NOTE — Interval H&P Note (Signed)
History and Physical Interval Note:  01/26/2017 11:44 AM  Cathy Sanchez  has presented today for surgery, with the diagnosis of cholithiasiss  The various methods of treatment have been discussed with the patient and family. After consideration of risks, benefits and other options for treatment, the patient has consented to  Procedure(s): LAPAROSCOPIC CHOLECYSTECTOMY (N/A) as a surgical intervention .  The patient's history has been reviewed, patient examined, no change in status, stable for surgery.  I have reviewed the patient's chart and labs.  Questions were answered to the patient's satisfaction.     [place]

## 2017-01-26 NOTE — Anesthesia Post-op Follow-up Note (Cosign Needed)
Anesthesia QCDR form completed.        

## 2017-01-26 NOTE — Discharge Instructions (Signed)

## 2017-01-26 NOTE — H&P (View-Only) (Signed)
Patient ID: Cathy Sanchez, female   DOB: 1966-07-13, 51 y.o.   MRN: [id number]  HPI Cathy Sanchez is a 51 y.o. female all non-to me with a history of abdominal pain for a few months. She reports persistent intermittent right upper quadrant and epigastric pain. She recently underwent a CT scan of the abdomen and pelvis I given that there was dilation on her CBD from a previous ultrasound. And the CT scan I have personally reviewed and is pretty much unremarkable. There is no evidence of common bile duct malignancy is, feeling defects or pancreatic tumors. No evidence of intrahepatic dilation. Normal pancreas. He continues to have epigastric and right upper quadrant pain and so far the only positive finding has been gallstones. She does drink and but her LFTs are completely normal with a normal albumin and a normal creatinine.  HPI  Past Medical History:  Diagnosis Date  . Lung collapse     Past Surgical History:  Procedure Laterality Date  . ABDOMINAL HYSTERECTOMY    . collapse lung     patient doesn;t remember    Family History  Problem Relation Age of Onset  . Cancer Father     Social History Social History  Substance Use Topics  . Smoking status: Current Every Day Smoker    Packs/day: 2.00    Years: 30.00    Types: Cigarettes  . Smokeless tobacco: Never Used  . Alcohol use Yes    No Known Allergies  Current Outpatient Prescriptions  Medication Sig Dispense Refill  . ranitidine (ZANTAC) 150 MG tablet Take 1 tablet (150 mg total) by mouth 2 (two) times daily. 60 tablet 0  . sucralfate (CARAFATE) 1 g tablet Take 1 tablet (1 g total) by mouth 4 (four) times daily. 60 tablet 0   No current facility-administered medications for this visit.      Review of Systems A 10 point review of systems was asked and was negative except for the information on the HPI  Physical Exam Blood pressure 102/63, pulse 71, temperature 97.9 F (36.6 C), temperature source Oral, height 5\' 4"   (1.626 m), weight 53.5 kg (118 lb). CONSTITUTIONAL: NAD EYES: Pupils are equal, round, and reactive to light, Sclera are non-icteric. EARS, NOSE, MOUTH AND THROAT: The oropharynx is clear. The oral mucosa is pink and moist. Hearing is intact to voice. LYMPH NODES:  Lymph nodes in the neck are normal. RESPIRATORY:  Lungs are clear. There is normal respiratory effort, with equal breath sounds bilaterally, and without pathologic use of accessory muscles. CARDIOVASCULAR: Heart is regular without murmurs, gallops, or rubs. GI: The abdomen is soft,Mild tender to palpation in the epigastric area and right upper quadrant. No peritonitis GU: Rectal deferred.   MUSCULOSKELETAL: Normal muscle strength and tone. No cyanosis or edema.   SKIN: Turgor is good and there are no pathologic skin lesions or ulcers. NEUROLOGIC: Motor and sensation is grossly normal. Cranial nerves are grossly intact. PSYCH:  Oriented to person, place and time. Affect is normal.  Data Reviewed  I have personally reviewed the patient's imaging, laboratory findings and medical records.    Assessment/ Plan Abdominal pain from presumed symptomatic cholelithiasis. Discussed with the patient in detail about the possibility that the gallstones might be causing her symptoms. She does have a history of ethanol abuse and I will order some polysubstance abuse panel as well as repeat CBC and a CMP including an ethanol level. An extensive discussion with patient regarding options with further workup by GI for upper  and lower GI versus proceeding to the operating room for cholecystectomy. Her symptoms can deftly be explained by symptomatic cholelithiasis. After clear discussion with the patient that even a cholecystectomy may not alleviate her symptoms she wishes to go ahead and have her gallbladder removed. She is in agreement that if she has persistent symptoms may need to have an additional endoscopic workup. The risks, benefits, complications,  treatment options, and expected outcomes were discussed with the patient. The possibilities of bleeding, recurrent infection, finding a normal gallbladder, perforation of viscus organs, damage to surrounding structures, bile leak, abscess formation, needing a drain placed, the need for additional procedures, reaction to medication, pulmonary aspiration,  failure to diagnose a condition, the possible need to convert to an open procedure, and creating a complication requiring transfusion or operation were discussed with the patient. The patient concurred with the proposed plan, giving informed consent.  Obviously if there is any substance on more we'll have to cancel the procedure. We will tentatively schedule for elective cholecystectomy as an outpatient. Plan lap chole w IOC  Caroleen Hamman, MD FACS General Surgeon 01/11/2017, 1:01 PM

## 2017-01-26 NOTE — Op Note (Signed)
Laparoscopic Cholecystectomy w IOC  Pre-operative Diagnosis: Symptomatic cholelithiasis  Post-operative Diagnosis: Same  Procedure: Lap chole w IOC  Surgeon: Caroleen Hamman, MD FACS  Anesthesia: Gen. with endotracheal tube  Findings: CBD 11 mm, no evidence of filling defects, contrast reaches the duodenum.  Estimated Blood Loss: 5cc         Drains: none         Specimens: Gallbladder           Complications: none   Procedure Details  The patient was seen again in the Holding Room. The benefits, complications, treatment options, and expected outcomes were discussed with the patient. The risks of bleeding, infection, recurrence of symptoms, failure to resolve symptoms, bile duct damage, bile duct leak, retained common bile duct stone, bowel injury, any of which could require further surgery and/or ERCP, stent, or papillotomy were reviewed with the patient. The likelihood of improving the patient's symptoms with return to their baseline status is good.  The patient and/or family concurred with the proposed plan, giving informed consent.  The patient was taken to Operating Room, identified as Isidore Moos and the procedure verified as Laparoscopic Cholecystectomy.  A Time Out was held and the above information confirmed.  Prior to the induction of general anesthesia, antibiotic prophylaxis was administered. VTE prophylaxis was in place. General endotracheal anesthesia was then administered and tolerated well. After the induction, the abdomen was prepped with Chloraprep and draped in the sterile fashion. The patient was positioned in the supine position.  Local anesthetic  was injected into the skin near the umbilicus and an incision made. Cut down technique was used to enter the abdominal cavity and a Hasson trochar was placed after two vicryl stitches were anchored to the fascia. Pneumoperitoneum was then created with CO2 and tolerated well without any adverse changes in the patient's  vital signs.  Three 5-mm ports were placed in the right upper quadrant all under direct vision. All skin incisions  were infiltrated with a local anesthetic agent before making the incision and placing the trocars.   The patient was positioned  in reverse Trendelenburg, tilted slightly to the patient's left.  The gallbladder was identified, the fundus grasped and retracted cephalad. Adhesions were lysed bluntly. The infundibulum was grasped and retracted laterally, exposing the peritoneum overlying the triangle of Calot. This was then divided and exposed in a blunt fashion. An extended critical view of the cystic duct and cystic artery was obtained.   A ductotomy was created and cholangiogram catheter inserted in the cystic duct. Constrast was injected, no evidence of filling defects, No extrinsic compression, no injury to the Bile ducts. Constrast reached the hepatic radicals and the duodenum. Catheter removed and the  Artery and duct were double clipped and divided.  The gallbladder was taken from the gallbladder fossa in a retrograde fashion with the electrocautery. The gallbladder was removed and placed in an Endocatch bag. The liver bed was irrigated and inspected. Hemostasis was achieved with the electrocautery. Copious irrigation was utilized and was repeatedly aspirated until clear.  The gallbladder and Endocatch sac were then removed through the epigastric port site.    Inspection of the right upper quadrant was performed. No bleeding, bile duct injury or leak, or bowel injury was noted. Pneumoperitoneum was released.  The periumbilical port site was closed with figure-of-eight 0 Vicryl sutures. 4-0 subcuticular Monocryl was used to close the skin. Dermabond was  applied.  The patient was then extubated and brought to the recovery room in  stable condition. Sponge, lap, and needle counts were correct at closure and at the conclusion of the case.               Caroleen Hamman, MD, FACS

## 2017-01-26 NOTE — Transfer of Care (Signed)
Immediate Anesthesia Transfer of Care Note  Patient: Cathy Sanchez  Procedure(s) Performed: Procedure(s): LAPAROSCOPIC CHOLECYSTECTOMY WITH INTRAOPERATIVE CHOLANGIOGRAM  Patient Location: PACU  Anesthesia Type:General  Level of Consciousness: sedated  Airway & Oxygen Therapy: Patient Spontanous Breathing and Patient connected to face mask oxygen  Post-op Assessment: Report given to RN and Post -op Vital signs reviewed and stable  Post vital signs: Reviewed and stable  Last Vitals:  Vitals:   01/26/17 1123 01/26/17 1427  BP: (!) 146/73 (!) 148/86  Pulse: (!) 54 76  Resp: 16 20  Temp: 36.4 C 36.3 C    Last Pain:  Vitals:   01/26/17 1123  TempSrc: Oral         Complications: No apparent anesthesia complications

## 2017-01-27 ENCOUNTER — Encounter: Payer: Self-pay | Admitting: Surgery

## 2017-01-27 NOTE — Anesthesia Postprocedure Evaluation (Signed)
Anesthesia Post Note  Patient: Cathy Sanchez  Procedure(s) Performed: Procedure(s): LAPAROSCOPIC CHOLECYSTECTOMY WITH INTRAOPERATIVE CHOLANGIOGRAM  Patient location during evaluation: PACU Anesthesia Type: General Level of consciousness: awake and alert Pain management: pain level controlled Vital Signs Assessment: post-procedure vital signs reviewed and stable Respiratory status: spontaneous breathing, nonlabored ventilation, respiratory function stable and patient connected to nasal cannula oxygen Cardiovascular status: blood pressure returned to baseline and stable Postop Assessment: no signs of nausea or vomiting Anesthetic complications: no     Last Vitals:  Vitals:   01/26/17 1531 01/26/17 1553  BP: (!) 144/91 140/86  Pulse: 66 63  Resp:    Temp: 36.2 C     Last Pain:  Vitals:   01/27/17 0901  TempSrc:   PainSc: 3                  Martha Clan

## 2017-01-28 LAB — SURGICAL PATHOLOGY

## 2017-02-09 ENCOUNTER — Encounter: Payer: Self-pay | Admitting: Surgery

## 2017-03-02 ENCOUNTER — Telehealth: Payer: Self-pay

## 2017-03-02 ENCOUNTER — Encounter: Payer: Self-pay | Admitting: *Deleted

## 2017-03-02 ENCOUNTER — Emergency Department
Admission: EM | Admit: 2017-03-02 | Discharge: 2017-03-02 | Disposition: A | Discharge status: Home / Self Care | Payer: Self-pay | Attending: Emergency Medicine | Admitting: Emergency Medicine

## 2017-03-02 DIAGNOSIS — Z9049 Acquired absence of other specified parts of digestive tract: Secondary | ICD-10-CM | POA: Insufficient documentation

## 2017-03-02 DIAGNOSIS — G8918 Other acute postprocedural pain: Secondary | ICD-10-CM | POA: Insufficient documentation

## 2017-03-02 DIAGNOSIS — Z760 Encounter for issue of repeat prescription: Secondary | ICD-10-CM | POA: Insufficient documentation

## 2017-03-02 DIAGNOSIS — F17210 Nicotine dependence, cigarettes, uncomplicated: Secondary | ICD-10-CM | POA: Insufficient documentation

## 2017-03-02 LAB — URINALYSIS, COMPLETE (UACMP) WITH MICROSCOPIC
BILIRUBIN URINE: NEGATIVE
Bacteria, UA: NONE SEEN
GLUCOSE, UA: NEGATIVE mg/dL
Ketones, ur: 80 mg/dL — AB
LEUKOCYTES UA: NEGATIVE
NITRITE: NEGATIVE
Protein, ur: NEGATIVE mg/dL
SPECIFIC GRAVITY, URINE: 1.019 (ref 1.005–1.030)
pH: 5 (ref 5.0–8.0)

## 2017-03-02 LAB — CBC WITH DIFFERENTIAL/PLATELET
BASOS PCT: 1 %
Basophils Absolute: 0 10*3/uL (ref 0–0.1)
EOS ABS: 0 10*3/uL (ref 0–0.7)
EOS PCT: 1 %
HCT: 38.7 % (ref 35.0–47.0)
Hemoglobin: 12.3 g/dL (ref 12.0–16.0)
Lymphocytes Relative: 34 %
Lymphs Abs: 2 10*3/uL (ref 1.0–3.6)
MCH: 23 pg — AB (ref 26.0–34.0)
MCHC: 31.8 g/dL — ABNORMAL LOW (ref 32.0–36.0)
MCV: 72.3 fL — ABNORMAL LOW (ref 80.0–100.0)
MONO ABS: 0.4 10*3/uL (ref 0.2–0.9)
MONOS PCT: 7 %
NEUTROS PCT: 57 %
Neutro Abs: 3.4 10*3/uL (ref 1.4–6.5)
PLATELETS: 224 10*3/uL (ref 150–440)
RBC: 5.36 MIL/uL — ABNORMAL HIGH (ref 3.80–5.20)
RDW: 14.1 % (ref 11.5–14.5)
WBC: 5.8 10*3/uL (ref 3.6–11.0)

## 2017-03-02 LAB — COMPREHENSIVE METABOLIC PANEL
ALK PHOS: 69 U/L (ref 38–126)
ALT: 15 U/L (ref 14–54)
AST: 15 U/L (ref 15–41)
Albumin: 4.1 g/dL (ref 3.5–5.0)
Anion gap: 9 (ref 5–15)
BUN: 21 mg/dL — AB (ref 6–20)
CALCIUM: 9.2 mg/dL (ref 8.9–10.3)
CO2: 26 mmol/L (ref 22–32)
CREATININE: 0.42 mg/dL — AB (ref 0.44–1.00)
Chloride: 100 mmol/L — ABNORMAL LOW (ref 101–111)
GFR calc non Af Amer: >60 mL/min (ref >60)
Glucose, Bld: 134 mg/dL — ABNORMAL HIGH (ref 65–99)
Potassium: 4.1 mmol/L (ref 3.5–5.1)
SODIUM: 135 mmol/L (ref 135–145)
Total Bilirubin: 0.9 mg/dL (ref 0.3–1.2)
Total Protein: 7.5 g/dL (ref 6.5–8.1)

## 2017-03-02 LAB — LIPASE, BLOOD: LIPASE: 17 U/L (ref 11–51)

## 2017-03-02 MED ORDER — METOCLOPRAMIDE HCL 5 MG PO TABS: 5.0000 mg | ORAL_TABLET | Freq: Three times a day (TID) | ORAL | 0 refills | Status: DC | PRN

## 2017-03-02 MED ORDER — FAMOTIDINE IN NACL 20-0.9 MG/50ML-% IV SOLN
20.0000 mg | Freq: Once | INTRAVENOUS | Status: AC
  Administered 2017-03-02: 20 mg via INTRAVENOUS
  Filled 2017-03-02: qty 50

## 2017-03-02 MED ORDER — TRAMADOL HCL 50 MG PO TABS: 50.0000 mg | ORAL_TABLET | Freq: Two times a day (BID) | ORAL | 0 refills | Status: DC

## 2017-03-02 MED ORDER — RANITIDINE HCL 150 MG PO TABS: 150.0000 mg | ORAL_TABLET | Freq: Two times a day (BID) | ORAL | 0 refills | Status: DC

## 2017-03-02 MED ORDER — SUCRALFATE 1 G PO TABS
1.0000 g | ORAL_TABLET | Freq: Once | ORAL | Status: AC
  Administered 2017-03-02: 1 g via ORAL
  Filled 2017-03-02: qty 1

## 2017-03-02 MED ORDER — SODIUM CHLORIDE 0.9 % IV BOLUS (SEPSIS)
1000.0000 mL | Freq: Once | INTRAVENOUS | Status: AC
  Administered 2017-03-02: 1000 mL via INTRAVENOUS

## 2017-03-02 MED ORDER — SUCRALFATE 1 G PO TABS: 1.0000 g | ORAL_TABLET | Freq: Three times a day (TID) | ORAL | 0 refills | Status: DC

## 2017-03-02 MED ORDER — FAMOTIDINE 20 MG PO TABS
20.0000 mg | ORAL_TABLET | Freq: Once | ORAL | Status: AC
  Administered 2017-03-02: 20 mg via ORAL
  Filled 2017-03-02: qty 1

## 2017-03-02 MED ORDER — KETOROLAC TROMETHAMINE 30 MG/ML IJ SOLN
30.0000 mg | Freq: Once | INTRAMUSCULAR | Status: AC
Start: 2017-03-02 — End: 2017-03-02
  Administered 2017-03-02: 30 mg via INTRAVENOUS
  Filled 2017-03-02: qty 1

## 2017-03-02 MED ORDER — METOCLOPRAMIDE HCL 5 MG/ML IJ SOLN
10.0000 mg | Freq: Once | INTRAMUSCULAR | Status: AC
  Administered 2017-03-02: 10 mg via INTRAVENOUS
  Filled 2017-03-02: qty 2

## 2017-03-02 NOTE — Discharge Instructions (Signed)
Your exam and labs are normal today. You should continue to drink fluids and take your medicines as directed. Follow-up with Dr. Dahlia Byes this week or next for ongoing symptoms.

## 2017-03-02 NOTE — Telephone Encounter (Signed)
Patient calls in and states that she is having Chest Pain and tachycardia. Denies fever/chills, nausea/vomiting, constipation/ diarrhea. She is not taking her Carafate and Zantac as prescribed. Denies drug use today.  Patient was told to go immediately to the Emergency Room. She verbalizes understanding.

## 2017-03-02 NOTE — ED Triage Notes (Signed)
States she had her gallbladder removed on 4/15 and states they did not give her anything for pain and she has continued pain, denies any nausea or vomiting

## 2017-03-02 NOTE — ED Notes (Signed)
Pt given Pepcid and carafate and vomited 100 cc while swallowing pills. PA notified.

## 2017-03-02 NOTE — ED Provider Notes (Signed)
[facility] Emergency Department Provider Note ____________________________________________  Time seen: 1503  I have reviewed the triage vital signs and the nursing notes.  HISTORY  Chief Complaint  Pain and Medication Refill  HPI Cathy Sanchez is a 51 y.o. female presents to the ED at the advice of her surgeon's office. She complains of abdominal pain, among other things. Patient is about 4 weeks status post a laparoscopic cholecystectomy. She called the surgeon's office today to report chest painand increased heart rate. She denied to the nurse any fevers, chills, nausea, vomiting, constipation, or diarrhea. She also reported to the nurse and to me that she is not taking her previously prescribed Carafate or ranitidine. She is not taking any medications today for symptom relief. She reports that she's had decreased appetite and decreased urine output. She reports to me that she did not receive any prescription pain medicine following the procedure. Review of her chart however, notes that she was given a prescription for #30 hydrocodone on 3/21, her surgery date. Patient claims she has not been seen by Dr. Dahlia Byes, since her surgery for follow-up. She has been working since 3 days after release from surgery apparently.  Past Medical History:  Diagnosis Date  . Anemia    h/o  . GERD (gastroesophageal reflux disease)    no meds-pt states she has no problems with gerd but has been told she has gerd by pcp  . Lung collapse    right-1st happened during pregnancy when she coughed and then happened again in 2016    Patient Active Problem List   Diagnosis Date Noted  . Calculus of gallbladder without cholecystitis without obstruction     Past Surgical History:  Procedure Laterality Date  . ABDOMINAL HYSTERECTOMY    . CHEST TUBE INSERTION    . CHOLECYSTECTOMY  01/26/2017   Procedure: LAPAROSCOPIC CHOLECYSTECTOMY WITH INTRAOPERATIVE CHOLANGIOGRAM;  Surgeon: Jules Husbands, MD;  Location: [facility] ORS;  Service: General;;  . collapse lung     patient doesn;t remember    Prior to Admission medications   Medication Sig Start Date End Date Taking? Authorizing Provider  ENSURE PLUS (ENSURE PLUS) LIQD Take 237 mLs by mouth daily.    Historical Provider, MD  HYDROcodone-acetaminophen (NORCO/VICODIN) 5-325 MG tablet Take 1-2 tablets by mouth every 6 (six) hours as needed for moderate pain. 01/26/17   Diego Sarita Haver, MD  metoCLOPramide (REGLAN) 5 MG tablet Take 1 tablet (5 mg total) by mouth every 8 (eight) hours as needed for nausea or vomiting. 03/02/17   Dannielle Karvonen Truth Wolaver, PA-C  naproxen sodium (ANAPROX) 220 MG tablet Take 220 mg by mouth 2 (two) times daily as needed (for back pain/headaches/general pain).    Historical Provider, MD  ranitidine (ZANTAC) 150 MG tablet Take 1 tablet (150 mg total) by mouth 2 (two) times daily. Patient not taking: Reported on 01/14/2017 11/11/16 11/11/17  Orbie Pyo, MD  ranitidine (ZANTAC) 150 MG tablet Take 1 tablet (150 mg total) by mouth 2 (two) times daily. 03/02/17   Marcee Jacobs V Bacon Jaylea Plourde, PA-C  sucralfate (CARAFATE) 1 g tablet Take 1 tablet (1 g total) by mouth 4 (four) times daily. Patient taking differently: Take 1-2 g by mouth See admin instructions. 1-2 TABLETS TWO TO THREE TIMES DAILY FOR ABDOMINAL PAIN. 11/11/16 11/11/17  Orbie Pyo, MD  sucralfate (CARAFATE) 1 g tablet Take 1 tablet (1 g total) by mouth 4 (four) times daily -  with meals and at bedtime. 03/02/17 04/01/17  Mina Carlisi V Bacon Briell Paulette, PA-C  traMADol (ULTRAM) 50 MG tablet Take 1 tablet (50 mg total) by mouth 2 (two) times daily. 03/02/17   Dannielle Karvonen Jazalyn Mondor, PA-C    Allergies Patient has no known allergies.  Family History  Problem Relation Age of Onset  . Cancer Father     Social History Social History  Substance Use Topics  . Smoking status: Current Every Day Smoker    Packs/day: 2.00    Years: 30.00    Types: Cigarettes  .  Smokeless tobacco: Never Used  . Alcohol use Yes     Comment: occ beer and liqour    Review of Systems  Constitutional: Negative for fever. Cardiovascular: Positive for chest pain. Respiratory: Negative for shortness of breath. Gastrointestinal: Positive for abdominal pain. Denies vomiting and diarrhea. Genitourinary: Negative for dysuria. Musculoskeletal: Negative for back pain. Skin: Negative for rash. Neurological: Negative for headaches, focal weakness or numbness. ____________________________________________  PHYSICAL EXAM:  VITAL SIGNS: ED Triage Vitals [03/02/17 1402]  Enc Vitals Group     BP (!) 154/89     Pulse Rate 96     Resp 18     Temp 98.2 F (36.8 C)     Temp Source Oral     SpO2 100 %     Weight 105 lb (47.6 kg)     Height 5\' 3"  (1.6 m)     Head Circumference      Peak Flow      Pain Score 10     Pain Loc      Pain Edu?      Excl. in [place]?     Constitutional: Alert and oriented. Well appearing and in no distress. Head: Normocephalic and atraumatic. Eyes: Conjunctivae are normal. PERRL. Normal extraocular movements Cardiovascular: Normal rate, regular rhythm. Normal distal pulses. Respiratory: Normal respiratory effort. No wheezes/rales/rhonchi. Gastrointestinal: Soft, flat and nontender. No distention, rebound, guarding, rigidity, or organomegaly. Normal bowel sounds x 4. Well-healed laparoscopic scars.  Musculoskeletal: Nontender with normal range of motion in all extremities.  Neurologic:  Normal gait without ataxia. Normal speech and language. No gross focal neurologic deficits are appreciated. Skin:  Skin is warm, dry and intact. No rash noted. Psychiatric: Mood and affect are normal. Patient exhibits appropriate insight and judgment. ____________________________________________   LABS (pertinent positives/negatives)  Labs Reviewed  URINALYSIS, COMPLETE (UACMP) WITH MICROSCOPIC - Abnormal; Notable for the following:       Result Value   Color,  Urine YELLOW (*)    APPearance CLEAR (*)    Hgb urine dipstick MODERATE (*)    Ketones, ur 80 (*)    Squamous Epithelial / LPF 0-5 (*)    All other components within normal limits  COMPREHENSIVE METABOLIC PANEL - Abnormal; Notable for the following:    Chloride 100 (*)    Glucose, Bld 134 (*)    BUN 21 (*)    Creatinine, Ser 0.42 (*)    All other components within normal limits  CBC WITH DIFFERENTIAL/PLATELET - Abnormal; Notable for the following:    RBC 5.36 (*)    MCV 72.3 (*)    MCH 23.0 (*)    MCHC 31.8 (*)    All other components within normal limits  LIPASE, BLOOD  ____________________________________________  EKG   NSR 77 bpm No STEMI ____________________________________________  PROCEDURES  Reglan 10 mg IVP Famotidine 20 mg IVP Toradol 30 mg IVP NS 1000 ml IV ____________________________________________  INITIAL IMPRESSION / ASSESSMENT AND PLAN / ED COURSE  Patient to the ED about 4 weeks status post elective laparoscopic cholecystectomy with complaints of chest pain and tachycardia to her surgeon's office. She reports to the ED for evaluation at the urging of her surgeon. She is found to be afebrile and with vital signs stable. Her labs indicate some dehydration so she was given some IV fluids in the ED. She reports improvement of her symptoms and has gone to the bathroom several times during her tenure in the ED. She was initially given by mouth doses of Carafate and famotidine, but vomited those pills at home just after ingestion. Her labs otherwise reassuring and her exam is benign. She is discharged at this time with prescriptions for ranitidine, sucralfate, Reglan, and Ultram. She will follow-up with Dr. Dahlia Byes for routine postsurgical follow-up. Work note is provided for the patient. Precautions were reviewed. ____________________________________________  FINAL CLINICAL IMPRESSION(S) / ED DIAGNOSES  Final diagnoses:  Post-op pain      Melvenia Needles, PA-C 03/02/17 [place], PA-C 03/02/17 2329    Earleen Newport, MD 03/03/17 [phone]

## 2017-03-02 NOTE — ED Notes (Signed)
See triage note   States she had gallbladder surgery 03/21 States she is still having abd pain no fever or n/v  States she did not f/u post surgery

## 2017-03-21 ENCOUNTER — Encounter: Payer: Self-pay | Admitting: Emergency Medicine

## 2017-03-21 ENCOUNTER — Emergency Department
Admission: EM | Admit: 2017-03-21 | Discharge: 2017-03-21 | Disposition: A | Discharge status: Home / Self Care | Payer: Self-pay | Attending: Emergency Medicine | Admitting: Emergency Medicine

## 2017-03-21 DIAGNOSIS — F17210 Nicotine dependence, cigarettes, uncomplicated: Secondary | ICD-10-CM | POA: Insufficient documentation

## 2017-03-21 DIAGNOSIS — R1013 Epigastric pain: Secondary | ICD-10-CM | POA: Insufficient documentation

## 2017-03-21 DIAGNOSIS — R110 Nausea: Secondary | ICD-10-CM | POA: Insufficient documentation

## 2017-03-21 LAB — COMPREHENSIVE METABOLIC PANEL WITH GFR
ALT: 23 U/L (ref 14–54)
AST: 21 U/L (ref 15–41)
Albumin: 3.9 g/dL (ref 3.5–5.0)
Alkaline Phosphatase: 63 U/L (ref 38–126)
Anion gap: 6 (ref 5–15)
BUN: 17 mg/dL (ref 6–20)
CO2: 31 mmol/L (ref 22–32)
Calcium: 9.1 mg/dL (ref 8.9–10.3)
Chloride: 103 mmol/L (ref 101–111)
Creatinine, Ser: 0.56 mg/dL (ref 0.44–1.00)
GFR calc Af Amer: >60 mL/min
GFR calc non Af Amer: >60 mL/min
Glucose, Bld: 105 mg/dL — ABNORMAL HIGH (ref 65–99)
Potassium: 3.9 mmol/L (ref 3.5–5.1)
Sodium: 140 mmol/L (ref 135–145)
Total Bilirubin: 0.4 mg/dL (ref 0.3–1.2)
Total Protein: 7.2 g/dL (ref 6.5–8.1)

## 2017-03-21 LAB — URINALYSIS, COMPLETE (UACMP) WITH MICROSCOPIC
Bacteria, UA: NONE SEEN
Bilirubin Urine: NEGATIVE
Glucose, UA: NEGATIVE mg/dL
HGB URINE DIPSTICK: NEGATIVE
Ketones, ur: NEGATIVE mg/dL
Leukocytes, UA: NEGATIVE
NITRITE: NEGATIVE
Protein, ur: 30 mg/dL — AB
SPECIFIC GRAVITY, URINE: 1.024 (ref 1.005–1.030)
pH: 6 (ref 5.0–8.0)

## 2017-03-21 LAB — CBC
HEMATOCRIT: 33.2 % — AB (ref 35.0–47.0)
HEMOGLOBIN: 10.6 g/dL — AB (ref 12.0–16.0)
MCH: 22.8 pg — AB (ref 26.0–34.0)
MCHC: 31.9 g/dL — ABNORMAL LOW (ref 32.0–36.0)
MCV: 71.5 fL — AB (ref 80.0–100.0)
Platelets: 254 10*3/uL (ref 150–440)
RBC: 4.64 MIL/uL (ref 3.80–5.20)
RDW: 14.3 % (ref 11.5–14.5)
WBC: 6.6 10*3/uL (ref 3.6–11.0)

## 2017-03-21 LAB — LIPASE, BLOOD: Lipase: 21 U/L (ref 11–51)

## 2017-03-21 MED ORDER — OMEPRAZOLE 40 MG PO CPDR: 40.0000 mg | DELAYED_RELEASE_CAPSULE | Freq: Every day | ORAL | 0 refills | Status: DC

## 2017-03-21 MED ORDER — GI COCKTAIL ~~LOC~~
ORAL | Status: AC
  Administered 2017-03-21: 30 mL via ORAL
  Filled 2017-03-21: qty 30

## 2017-03-21 MED ORDER — GI COCKTAIL ~~LOC~~
30.0000 mL | Freq: Once | ORAL | Status: AC
  Administered 2017-03-21: 30 mL via ORAL

## 2017-03-21 MED ORDER — ONDANSETRON 4 MG PO TBDP: 4.0000 mg | ORAL_TABLET | Freq: Three times a day (TID) | ORAL | 0 refills | Status: DC | PRN

## 2017-03-21 MED ORDER — CALCIUM CARBONATE ANTACID 500 MG PO CHEW: 1.0000 | CHEWABLE_TABLET | Freq: Three times a day (TID) | ORAL | 0 refills | Status: AC | PRN

## 2017-03-21 NOTE — ED Notes (Signed)
Pt to desk and states her pain is getting worse.

## 2017-03-21 NOTE — ED Triage Notes (Signed)
Patient presents to the ED with abdominal pain x 7 months.  Patient states, "I can't tell when I need to eat or when I need to urinate."  Patient states pain caused her to cry today so she is worried it's getting worse.  Patient states she had gallbladder surgery in March and she states she thought that was going to fix this pain but it hasn't.  Patient denies vomiting and diarrhea, reports occasional nausea.

## 2017-03-21 NOTE — Discharge Instructions (Signed)
For your symptoms, please follow the food choices listed on the GERD diet recommendations. Do not take Motrin, IV Profen, Aleve, Advil, or any other NSAID medications. Avoid alcohol, fatty foods, and spicy foods. Please take omeprazole daily, regardless of your symptoms. You may take Tums if you're having pain. Zofran is for nausea. If you're having pain, you may take Tylenol.  Please establish a primary care physician for follow-up. If you continue to have pain, your primary care doctor may refer you to a gastrointestinal specialist.  Return to the emergency department if you develop severe pain, fever, inability to keep down fluids, or any other symptoms concerning to you.

## 2017-03-21 NOTE — ED Provider Notes (Signed)
[facility] Emergency Department Provider Note  ____________________________________________  Time seen: Approximately 5:28 PM  I have reviewed the triage vital signs and the nursing notes.   HISTORY  Chief Complaint Abdominal Pain    HPI Cathy Sanchez is a 51 y.o. female with a history of GERD, currently on medications, cholecystitis status post laparoscopic cholecystectomy3/21/18 presenting with epigastric pain. The patient reports that for 9 months she's been having epigastric pain, which has not improved after her cholecystectomy. She reports a burning sensation, but does not noted to be worse or better with food or positional changes. She states that her general surgeon told her she has heartburn, and then is not sure if she has been taking Xanax or Zantac for her symptoms but which ever one she has been taking has not helped. She is not taken any daily medication for GERD. She has intermittent mild nausea, but denies any vomiting, constipation or diarrhea, chest pain, shortness of breath, palpitations, lightheadedness or syncope. No fever.   Past Medical History:  Diagnosis Date  . Anemia    h/o  . GERD (gastroesophageal reflux disease)    no meds-pt states she has no problems with gerd but has been told she has gerd by pcp  . Lung collapse    right-1st happened during pregnancy when she coughed and then happened again in 2016    Patient Active Problem List   Diagnosis Date Noted  . Calculus of gallbladder without cholecystitis without obstruction     Past Surgical History:  Procedure Laterality Date  . ABDOMINAL HYSTERECTOMY    . CHEST TUBE INSERTION    . CHOLECYSTECTOMY  01/26/2017   Procedure: LAPAROSCOPIC CHOLECYSTECTOMY WITH INTRAOPERATIVE CHOLANGIOGRAM;  Surgeon: Jules Husbands, MD;  Location: [facility] ORS;  Service: General;;  . collapse lung     patient doesn;t remember    Current Outpatient Rx  . Order #: [id number] Class: Print  . Order  #: [id number] Class: Historical Med  . Order #: [id number] Class: Print  . Order #: [id number] Class: Print  . Order #: [id number] Class: Historical Med  . Order #: [id number] Class: Print  . Order #: [id number] Class: Print  . Order #: [id number] Class: Print  . Order #: [id number] Class: Print  . Order #: [id number] Class: Print  . Order #: [id number] Class: Print  . Order #: [id number] Class: Print    Allergies Patient has no known allergies.  Family History  Problem Relation Age of Onset  . Cancer Father     Social History Social History  Substance Use Topics  . Smoking status: Current Every Day Smoker    Packs/day: 2.00    Years: 30.00    Types: Cigarettes  . Smokeless tobacco: Never Used  . Alcohol use Yes     Comment: occ beer and liqour    Review of Systems Constitutional: No fever/chills.No lightheadedness or syncope. Eyes: No visual changes. ENT: No sore throat. No congestion or rhinorrhea. Cardiovascular: Denies chest pain. Denies palpitations. Respiratory: Denies shortness of breath.  No cough. Gastrointestinal: Positive epigastric abdominal pain.  Positive intermittent nausea, no vomiting.  No diarrhea.  No constipation. Genitourinary: Negative for dysuria. Musculoskeletal: Negative for back pain. Skin: Negative for rash. Neurological: Negative for headaches. No focal numbness, tingling or weakness.   10-point ROS otherwise negative.  ____________________________________________   PHYSICAL EXAM:  VITAL SIGNS: ED Triage Vitals  Enc Vitals Group     BP 03/21/17 1204 (!) 134/93     Pulse Rate 03/21/17 1204 69     Resp 03/21/17 1204 18  Temp 03/21/17 1204 98.1 F (36.7 C)     Temp Source 03/21/17 1204 Oral     SpO2 03/21/17 1204 100 %     Weight 03/21/17 1206 100 lb (45.4 kg)     Height 03/21/17 1206 5\' 3"  (1.6 m)     Head Circumference --      Peak Flow --      Pain Score 03/21/17 1205 8     Pain Loc --      Pain Edu? --      Excl. in [place]? --      Constitutional: Alert and oriented.  Answers questions appropriately.Chronically ill-appearing and slightly agitated on exam but able to be calmed Eyes: Conjunctivae are normal.  EOMI. No scleral icterus. Head: Atraumatic. Nose: No congestion/rhinnorhea. Mouth/Throat: Mucous membranes are moist.  Neck: No stridor.  Supple.  No JVD. No meningismus. Cardiovascular: Normal rate, regular rhythm. No murmurs, rubs or gallops.  Respiratory: Normal respiratory effort.  No accessory muscle use or retractions. Lungs CTAB.  No wheezes, rales or ronchi. Gastrointestinal: Soft, and nondistended. Positive mild epigastric tenderness to palpation. In the right upper quadrant, the patient has well-healed surgical lap or scopic incisions. Negative Murphy sign. No guarding or rebound.  No peritoneal signs. Musculoskeletal: No LE edema. No ttp in the calves or palpable cords.  Negative Homan's sign. Neurologic:  A&Ox3.  Speech is clear.  Face and smile are symmetric.  EOMI.  Moves all extremities well. Skin:  Skin is warm, dry and intact. No rash noted. Psychiatric: Mood and affect are normal, although the patient is intermittently agitated during the exam, she is able to be calmed  ____________________________________________   LABS (all labs ordered are listed, but only abnormal results are displayed)  Labs Reviewed  COMPREHENSIVE METABOLIC PANEL - Abnormal; Notable for the following:       Result Value   Glucose, Bld 105 (*)    All other components within normal limits  CBC - Abnormal; Notable for the following:    Hemoglobin 10.6 (*)    HCT 33.2 (*)    MCV 71.5 (*)    MCH 22.8 (*)    MCHC 31.9 (*)    All other components within normal limits  URINALYSIS, COMPLETE (UACMP) WITH MICROSCOPIC - Abnormal; Notable for the following:    Color, Urine YELLOW (*)    APPearance HAZY (*)    Protein, ur 30 (*)    Squamous Epithelial / LPF 0-5 (*)    All other components within normal limits  LIPASE,  BLOOD   ____________________________________________  EKG  ED ECG REPORT I, Eula Listen, the attending physician, personally viewed and interpreted this ECG.   Date: 03/21/2017  EKG Time: 1200  Rate: 67  Rhythm: normal sinus rhythm  Axis: normal  Intervals:none  ST&T Change: No STEMI  ____________________________________________  RADIOLOGY  No results found.  ____________________________________________   PROCEDURES  Procedure(s) performed: None  Procedures  Critical Care performed: No ____________________________________________   INITIAL IMPRESSION / ASSESSMENT AND PLAN / ED COURSE  Pertinent labs & imaging results that were available during my care of the patient were reviewed by me and considered in my medical decision making (see chart for details).  51 y.o. female with a history of GERD not currently on any daily medication presenting with ongoing epigastric discomfort and intermittent mild nausea. Overall, the patient has reassuring vital signs and is afebrile. Her abdomen has some epigastric discomfort. She most likely has GERD, but I would also consider peptic  or gastric ulcer. There is no evidence today that she is having a complication from her surgery several weeks ago, as the patient is not having any fever does not have any right upper quadrant pain, and has reassuring laboratory studies. Her cardiac workup, which is a much less likely cause for his symptoms, is also reassuring with an EKG that does not show ischemic changes, no cardiopulmonary process on chest x-ray. Her lipase is also normal. The patient has a hemoglobin of 10.6, but has baseline anemia within this range. She is not been seeing black or tarry stools, so do not think she has a bleeding ulcer. I will plan to start the patient on daily omeprazole, Zofran and Tums as needed for symptoms, and have her follow-up with her primary care physician. She understands that if she continues to be  symptomatic, she will require referral to GI. In the meantime, I have given her strict return precautions and we have discussed her discharge instructions.  ____________________________________________  FINAL CLINICAL IMPRESSION(S) / ED DIAGNOSES  Final diagnoses:  Epigastric pain  Nausea         NEW MEDICATIONS STARTED DURING THIS VISIT:  New Prescriptions   CALCIUM CARBONATE (TUMS) 500 MG CHEWABLE TABLET    Chew 1 tablet (200 mg of elemental calcium total) by mouth 3 (three) times daily as needed for indigestion or heartburn.   OMEPRAZOLE (PRILOSEC) 40 MG CAPSULE    Take 1 capsule (40 mg total) by mouth daily.   ONDANSETRON (ZOFRAN ODT) 4 MG DISINTEGRATING TABLET    Take 1 tablet (4 mg total) by mouth every 8 (eight) hours as needed for nausea or vomiting.      Eula Listen, MD 03/21/17 1734

## 2017-03-22 ENCOUNTER — Telehealth: Payer: Self-pay | Admitting: Emergency Medicine

## 2017-03-22 NOTE — Telephone Encounter (Signed)
Patient called to say she cannot afford her prescriptions.  I printed out goodrx coupons for zofran and prilosec.  I called [facility] to inform them that the zofran can be changed to tablets instead of odt so the coupon will apply.  Dr Darl Householder informed of change.

## 2017-03-28 ENCOUNTER — Telehealth: Payer: Self-pay

## 2017-03-28 NOTE — Telephone Encounter (Signed)
Patient had a laparoscopic cholecystectomy on 01/26/2017 with Dr. Dahlia Byes. She no showed for the appointment that was scheduled for her and was seen in the ED on 03/02/17 & 03/21/17.   I have called and left her a voicemail to call me back to schedule an appointment. I will try again at a later time.

## 2018-11-24 ENCOUNTER — Emergency Department
Admission: EM | Admit: 2018-11-24 | Discharge: 2018-11-24 | Disposition: A | Discharge status: Home / Self Care | Payer: Self-pay | Attending: Emergency Medicine | Admitting: Emergency Medicine

## 2018-11-24 ENCOUNTER — Emergency Department: Payer: Self-pay

## 2018-11-24 ENCOUNTER — Encounter: Payer: Self-pay | Admitting: Radiology

## 2018-11-24 ENCOUNTER — Other Ambulatory Visit: Payer: Self-pay

## 2018-11-24 DIAGNOSIS — R112 Nausea with vomiting, unspecified: Secondary | ICD-10-CM

## 2018-11-24 DIAGNOSIS — R1110 Vomiting, unspecified: Secondary | ICD-10-CM | POA: Insufficient documentation

## 2018-11-24 DIAGNOSIS — R1010 Upper abdominal pain, unspecified: Secondary | ICD-10-CM | POA: Insufficient documentation

## 2018-11-24 DIAGNOSIS — F17210 Nicotine dependence, cigarettes, uncomplicated: Secondary | ICD-10-CM | POA: Insufficient documentation

## 2018-11-24 LAB — URINALYSIS, COMPLETE (UACMP) WITH MICROSCOPIC
Bilirubin Urine: NEGATIVE
Glucose, UA: NEGATIVE mg/dL
Ketones, ur: NEGATIVE mg/dL
LEUKOCYTES UA: NEGATIVE
Nitrite: POSITIVE — AB
PROTEIN: NEGATIVE mg/dL
SPECIFIC GRAVITY, URINE: 1.017 (ref 1.005–1.030)
pH: 7 (ref 5.0–8.0)

## 2018-11-24 LAB — COMPREHENSIVE METABOLIC PANEL
ALBUMIN: 4 g/dL (ref 3.5–5.0)
ALT: 21 U/L (ref 0–44)
AST: 18 U/L (ref 15–41)
Alkaline Phosphatase: 61 U/L (ref 38–126)
Anion gap: 6 (ref 5–15)
BUN: 18 mg/dL (ref 6–20)
CHLORIDE: 101 mmol/L (ref 98–111)
CO2: 33 mmol/L — AB (ref 22–32)
CREATININE: 0.65 mg/dL (ref 0.44–1.00)
Calcium: 9.8 mg/dL (ref 8.9–10.3)
GFR calc Af Amer: >60 mL/min (ref >60)
GFR calc non Af Amer: >60 mL/min (ref >60)
GLUCOSE: 116 mg/dL — AB (ref 70–99)
POTASSIUM: 4 mmol/L (ref 3.5–5.1)
SODIUM: 140 mmol/L (ref 135–145)
Total Bilirubin: 0.7 mg/dL (ref 0.3–1.2)
Total Protein: 7.5 g/dL (ref 6.5–8.1)

## 2018-11-24 LAB — CBC
HCT: 36.1 % (ref 36.0–46.0)
Hemoglobin: 11.1 g/dL — ABNORMAL LOW (ref 12.0–15.0)
MCH: 23.2 pg — ABNORMAL LOW (ref 26.0–34.0)
MCHC: 30.7 g/dL (ref 30.0–36.0)
MCV: 75.4 fL — AB (ref 80.0–100.0)
PLATELETS: 259 10*3/uL (ref 150–400)
RBC: 4.79 MIL/uL (ref 3.87–5.11)
RDW: 14.3 % (ref 11.5–15.5)
WBC: 7 10*3/uL (ref 4.0–10.5)
nRBC: 0 % (ref 0.0–0.2)

## 2018-11-24 LAB — LIPASE, BLOOD: LIPASE: 27 U/L (ref 11–51)

## 2018-11-24 MED ORDER — ONDANSETRON 4 MG PO TBDP: 4.0000 mg | ORAL_TABLET | Freq: Three times a day (TID) | ORAL | 0 refills | Status: DC | PRN

## 2018-11-24 MED ORDER — IOHEXOL 300 MG/ML  SOLN
75.0000 mL | Freq: Once | INTRAMUSCULAR | Status: AC | PRN
  Administered 2018-11-24: 75 mL via INTRAVENOUS
  Filled 2018-11-24: qty 75

## 2018-11-24 MED ORDER — OXYCODONE-ACETAMINOPHEN 5-325 MG PO TABS: 1.0000 | ORAL_TABLET | Freq: Three times a day (TID) | ORAL | 0 refills | Status: DC | PRN

## 2018-11-24 MED ORDER — ONDANSETRON 4 MG PO TBDP
4.0000 mg | ORAL_TABLET | Freq: Once | ORAL | Status: AC
  Administered 2018-11-24: 4 mg via ORAL
  Filled 2018-11-24: qty 1

## 2018-11-24 MED ORDER — HYDROCODONE-ACETAMINOPHEN 5-325 MG PO TABS
2.0000 | ORAL_TABLET | Freq: Once | ORAL | Status: AC
Start: 2018-11-24 — End: 2018-11-24
  Administered 2018-11-24: 2 via ORAL
  Filled 2018-11-24: qty 2

## 2018-11-24 MED ORDER — CEPHALEXIN 500 MG PO CAPS: 500.0000 mg | ORAL_CAPSULE | Freq: Four times a day (QID) | ORAL | 0 refills | Status: AC

## 2018-11-24 MED ORDER — FAMOTIDINE 20 MG PO TABS: 20.0000 mg | ORAL_TABLET | Freq: Two times a day (BID) | ORAL | 1 refills | Status: DC

## 2018-11-24 NOTE — ED Triage Notes (Signed)
Pt arrived via POV with reports of daily abdominal pain x 4 years and vomiting x 2 yesterday.  Pt c/o nausea but has not vomited since yesterday.  Denies diarrhea.

## 2018-11-24 NOTE — ED Provider Notes (Signed)
[facility] Emergency Department Provider Note       Time seen: ----------------------------------------- 3:52 PM on 11/24/2018 -----------------------------------------   I have reviewed the triage vital signs and the nursing notes.  HISTORY   Chief Complaint Emesis and Abdominal Pain    HPI Cathy Sanchez is a 53 y.o. female with a history of anemia, GERD, pneumothorax, cholecystectomy who presents to the ED for abdominal pain for the past 4 years in the upper abdomen.  Patient has had vomiting that began yesterday.  She complains of nausea but denies any diarrhea.  She denies fevers, chills or other complaints.  Past Medical History:  Diagnosis Date  . Anemia    h/o  . GERD (gastroesophageal reflux disease)    no meds-pt states she has no problems with gerd but has been told she has gerd by pcp  . Lung collapse    right-1st happened during pregnancy when she coughed and then happened again in 2016    Patient Active Problem List   Diagnosis Date Noted  . Calculus of gallbladder without cholecystitis without obstruction     Past Surgical History:  Procedure Laterality Date  . ABDOMINAL HYSTERECTOMY    . CHEST TUBE INSERTION    . CHOLECYSTECTOMY  01/26/2017   Procedure: LAPAROSCOPIC CHOLECYSTECTOMY WITH INTRAOPERATIVE CHOLANGIOGRAM;  Surgeon: Jules Husbands, MD;  Location: [facility] ORS;  Service: General;;  . collapse lung     patient doesn;t remember    Allergies Patient has no known allergies.  Social History Social History   Tobacco Use  . Smoking status: Current Every Day Smoker    Packs/day: 2.00    Years: 30.00    Pack years: 60.00    Types: Cigarettes  . Smokeless tobacco: Never Used  Substance Use Topics  . Alcohol use: Yes    Comment: occ beer and liqour  . Drug use: Yes    Types: Marijuana    Comment: marijuana occassionally 2-3 days ago    Review of Systems Constitutional: Negative for fever. Cardiovascular: Negative  for chest pain. Respiratory: Negative for shortness of breath. Gastrointestinal: Positive for abdominal pain and vomiting Musculoskeletal: Negative for back pain. Skin: Negative for rash. Neurological: Negative for headaches, focal weakness or numbness.  All systems negative/normal/unremarkable except as stated in the HPI  ____________________________________________   PHYSICAL EXAM:  VITAL SIGNS: ED Triage Vitals  Enc Vitals Group     BP 11/24/18 1440 (!) 145/100     Pulse Rate 11/24/18 1440 (!) 57     Resp 11/24/18 1440 18     Temp 11/24/18 1440 98.5 F (36.9 C)     Temp Source 11/24/18 1440 Oral     SpO2 11/24/18 1440 100 %     Weight 11/24/18 1441 95 lb (43.1 kg)     Height 11/24/18 1441 5\' 4"  (1.626 m)     Head Circumference --      Peak Flow --      Pain Score 11/24/18 1450 8     Pain Loc --      Pain Edu? --      Excl. in [place]? --    Constitutional: Alert and oriented. Well appearing and in no distress. Eyes: Conjunctivae are normal. Normal extraocular movements. ENT      Head: Normocephalic and atraumatic.      Nose: No congestion/rhinnorhea.      Mouth/Throat: Mucous membranes are moist.      Neck: No stridor. Cardiovascular: Normal rate, regular rhythm. No murmurs,  rubs, or gallops. Respiratory: Normal respiratory effort without tachypnea nor retractions. Breath sounds are clear and equal bilaterally. No wheezes/rales/rhonchi. Gastrointestinal: Mild epigastric tenderness, no rebound or guarding.  Normal bowel sounds. Musculoskeletal: Nontender with normal range of motion in extremities. No lower extremity tenderness nor edema. Neurologic:  Normal speech and language. No gross focal neurologic deficits are appreciated.  Skin:  Skin is warm, dry and intact. No rash noted. Psychiatric: Mood and affect are normal. Speech and behavior are normal.  ____________________________________________  ED COURSE:  As part of my medical decision making, I reviewed the  following data within the [place] History obtained from family if available, nursing notes, old chart and ekg, as well as notes from prior ED visits. Patient presented for epigastric pain and vomiting, we will assess with labs and imaging as indicated at this time.   Procedures ____________________________________________   LABS (pertinent positives/negatives)  Labs Reviewed  COMPREHENSIVE METABOLIC PANEL - Abnormal; Notable for the following components:      Result Value   CO2 33 (*)    Glucose, Bld 116 (*)    All other components within normal limits  CBC - Abnormal; Notable for the following components:   Hemoglobin 11.1 (*)    MCV 75.4 (*)    MCH 23.2 (*)    All other components within normal limits  URINALYSIS, COMPLETE (UACMP) WITH MICROSCOPIC - Abnormal; Notable for the following components:   Color, Urine YELLOW (*)    APPearance CLOUDY (*)    Hgb urine dipstick MODERATE (*)    Nitrite POSITIVE (*)    Bacteria, UA MANY (*)    All other components within normal limits  LIPASE, BLOOD    RADIOLOGY Images were viewed by me  Abdomen 2 view/CT IMPRESSION: Interval cholecystectomy with mild dilatation of CBD 10 mm diameter, likely related to cholecystectomy, recommend correlation with LFTs.  Remainder of exam shows no significant abnormalities. ____________________________________________   DIFFERENTIAL DIAGNOSIS   Chronic pain, scar tissue, GERD, peptic ulcer disease, gastritis, choledocholithiasis  FINAL ASSESSMENT AND PLAN  Abdominal pain   Plan: The patient had presented for abdominal pain and vomiting. Patient's labs are likely UTI but no other acute process. Patient's imaging was negative.  She will be discharged with antibiotics and antacids.  She is cleared for outpatient follow-up with general surgery referral for pain that has been persistent since her cholecystectomy last year.   Laurence Aly, MD    Note: This note was  generated in part or whole with voice recognition software. Voice recognition is usually quite accurate but there are transcription errors that can and very often do occur. I apologize for any typographical errors that were not detected and corrected.     Earleen Newport, MD 11/24/18 [phone]

## 2018-11-24 NOTE — ED Notes (Signed)
Pt to CT with CT tech

## 2018-12-06 ENCOUNTER — Encounter: Payer: Self-pay | Admitting: *Deleted

## 2019-03-14 IMAGING — CT CT ABD-PELV W/ CM
2 of 5 series · 15 of 46 positions shown, 17 images · IV contrast (APPLIED)
Comparison: 12/13/2016

CLINICAL DATA: Acute abdominal pain, generalized abdominal pain for
4 years, vomiting twice yesterday, history GERD

EXAM:
CT ABDOMEN AND PELVIS WITH CONTRAST
TECHNIQUE: Multidetector CT imaging of the abdomen and pelvis was performed
using the standard protocol following bolus administration of
intravenous contrast. Sagittal and coronal MPR images reconstructed
from axial data set.
CONTRAST:  75mL OMNIPAQUE IOHEXOL 300 MG/ML SOLN IV. No oral
contrast.

[Series 2: axial st · axial · 0.56mm/px · z∈[-1154,-769]mm · 12 of 87 slices shown, 14 images]
[im 5/87  soft-tissue]
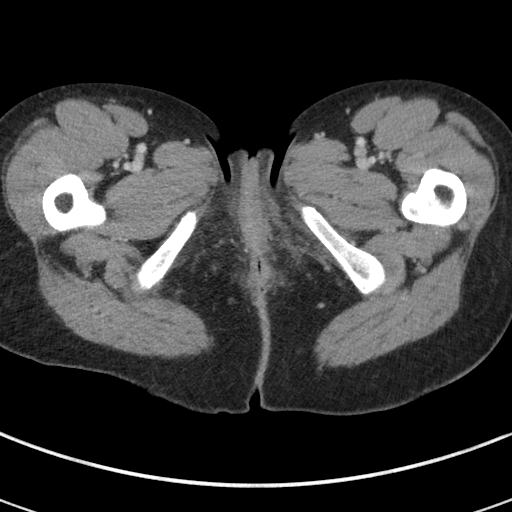
[im 5/87  bone]
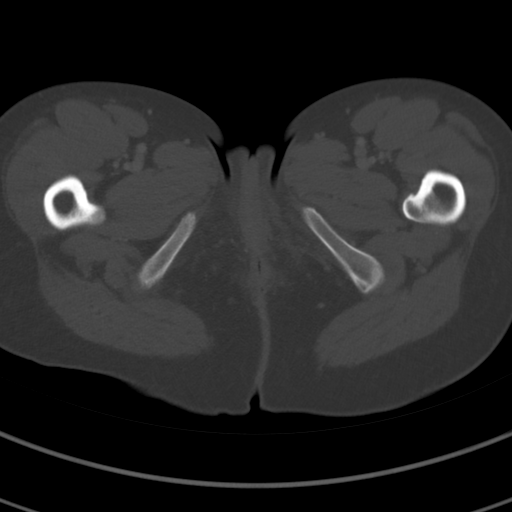
[im 14/87  soft-tissue]
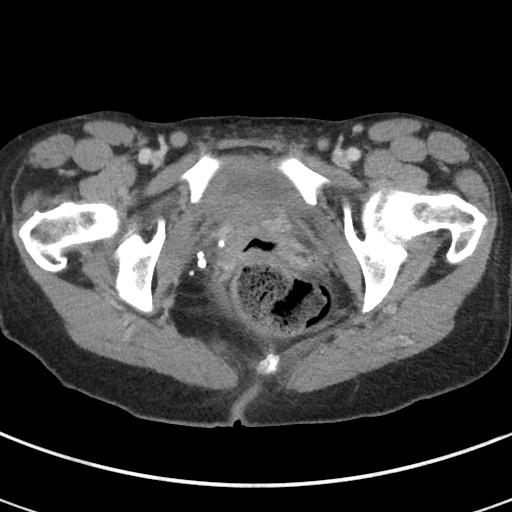
[im 19/87  soft-tissue]
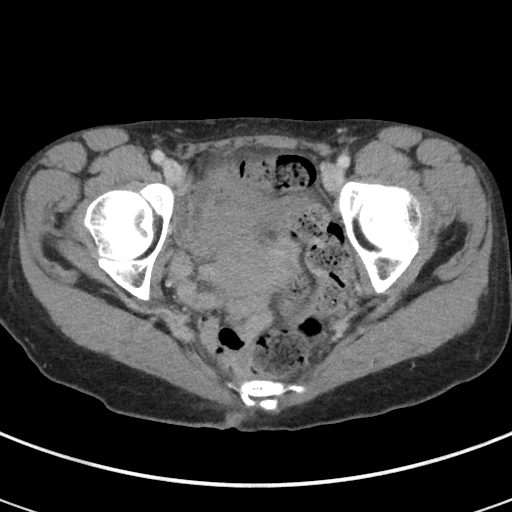
[im 28/87  soft-tissue]
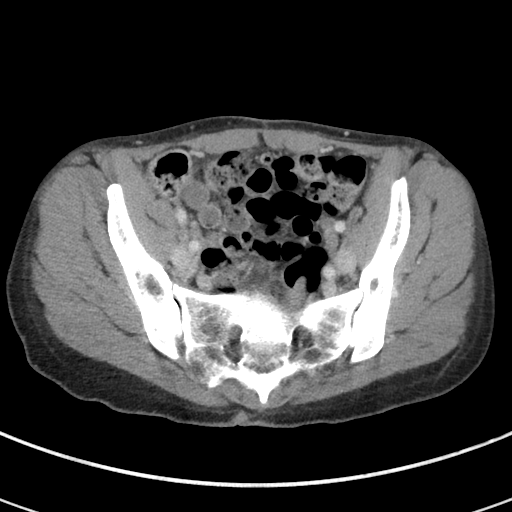
[im 32/87  soft-tissue]
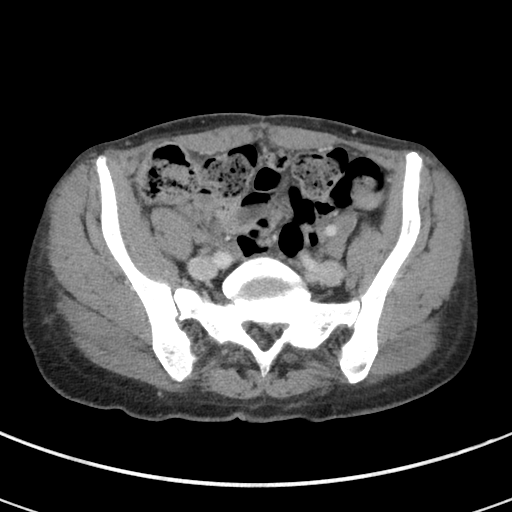
[im 41/87  soft-tissue]
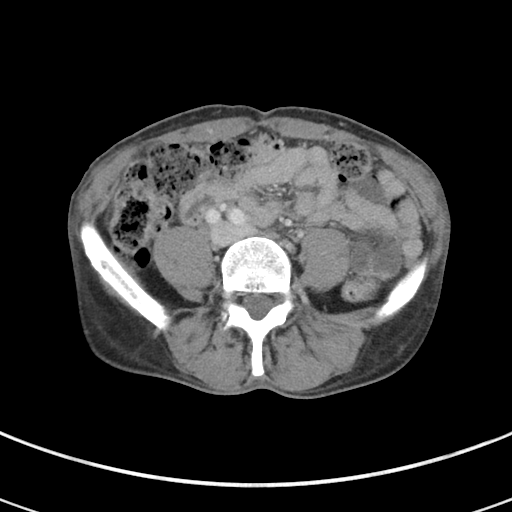
[im 46/87  soft-tissue]
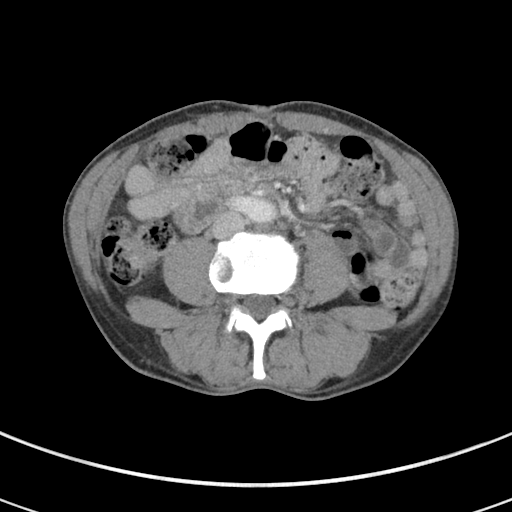
[im 55/87  soft-tissue]
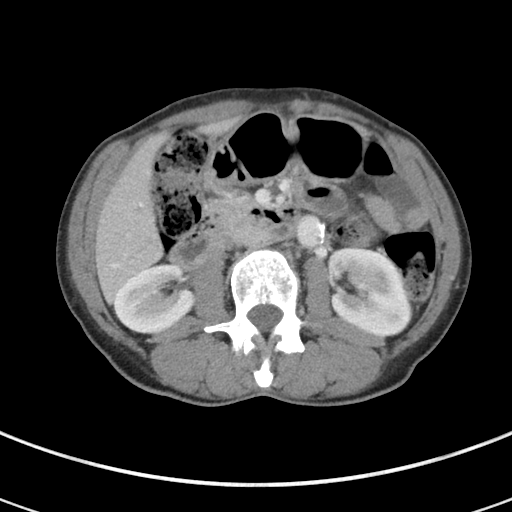
[im 59/87  soft-tissue]
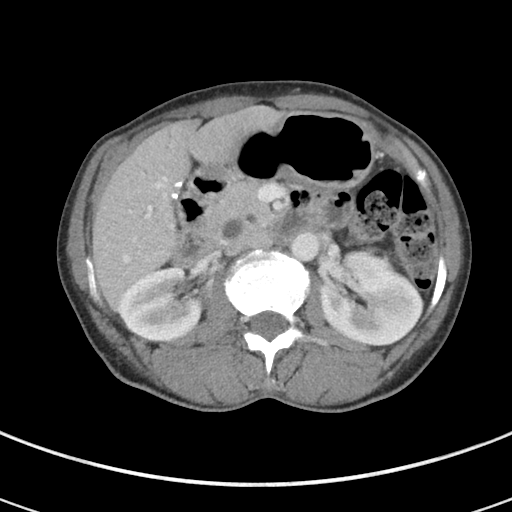
[im 59/87  bone]
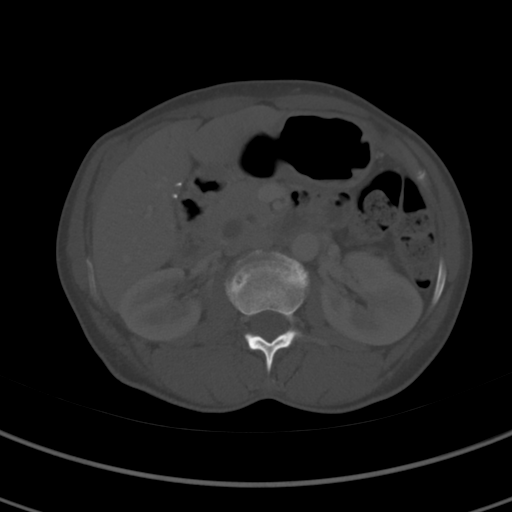
[im 68/87  soft-tissue]
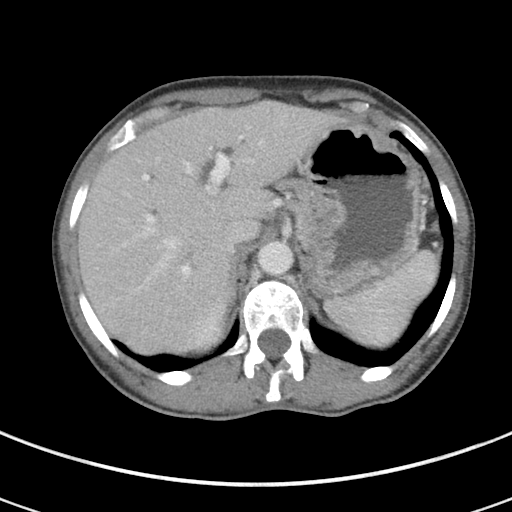
[im 73/87  soft-tissue]
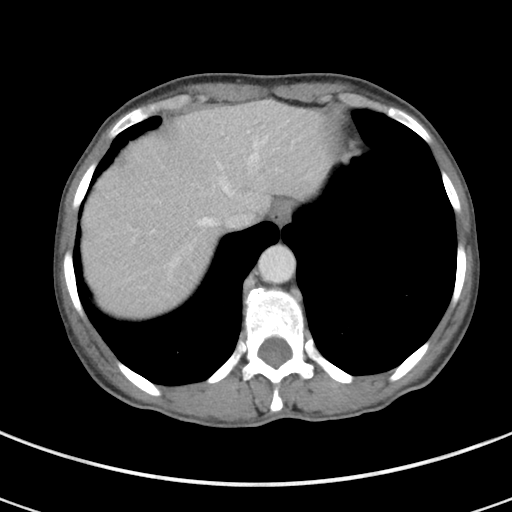
[im 82/87  soft-tissue]
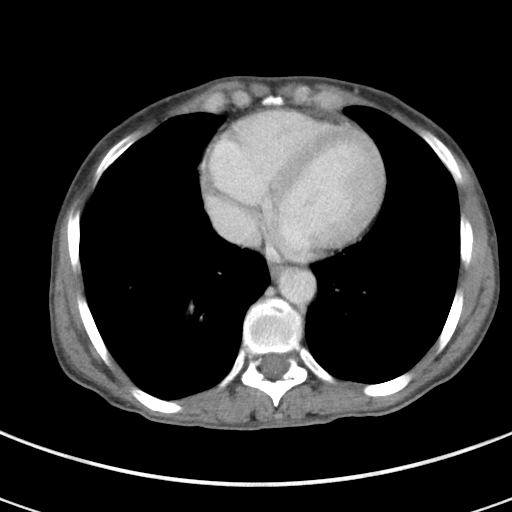

[Series 5: coronal st · coronal · 0.55mm/px · 3 of 66 slices shown]
[im 22/66  soft-tissue]
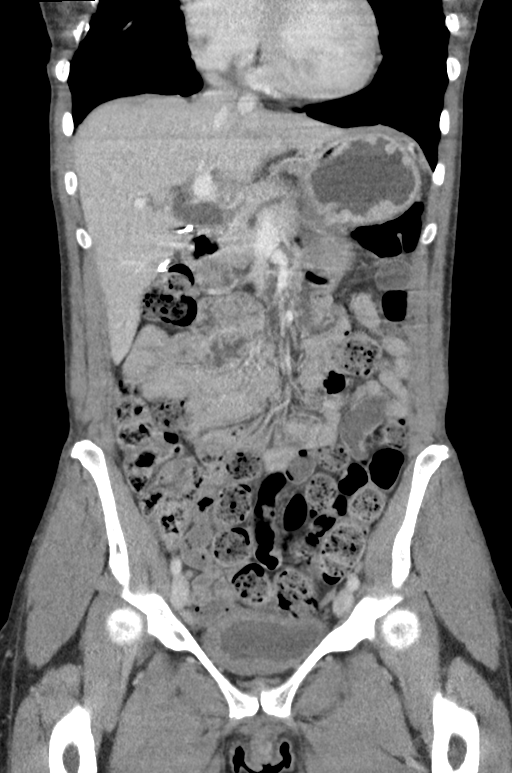
[im 29/66  soft-tissue]
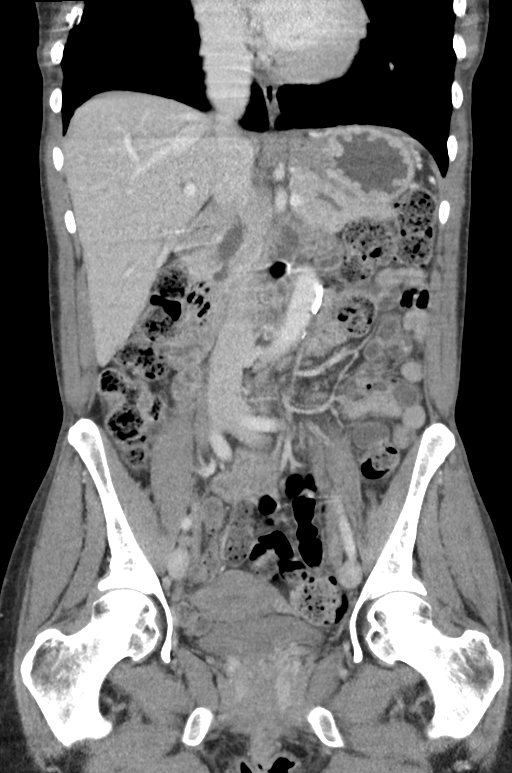
[im 37/66  soft-tissue]
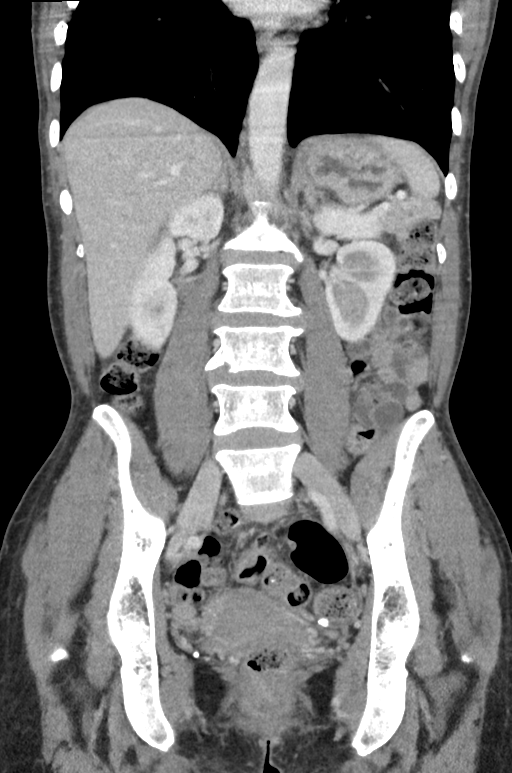

[15 of 46 positions shown; findings below may reference images not displayed]

FINDINGS: Lower chest: Minimal dependent atelectasis RIGHT lower lobe.

Hepatobiliary: Interval cholecystectomy. Dilated CBD 10 mm diameter
previously 8 mm, may be related to interval cholecystectomy. Mild
intrahepatic biliary dilatation. No focal hepatic mass lesions.

Pancreas: Normal appearance

Spleen: Normal appearance

Adrenals/Urinary Tract: Adrenal glands, kidneys, and ureters normal
appearance. Bladder wall appears thickened but bladder is
underdistended question artifact.

Stomach/Bowel: Scattered stool throughout colon. Appendix not
definitely visualized but no pericecal inflammatory process is seen.
Stomach grossly normal appearance. Small bowel loops unremarkable.
No bowel dilatation or bowel wall thickening.

Vascular/Lymphatic: Pelvis scattered pelvic phleboliths.
Atherosclerotic calcifications of a tortuous abdominal aorta without
aneurysm. No adenopathy.

Reproductive: Unremarkable uterus and adnexa.

Other: No free air or free fluid.  No hernia.

Musculoskeletal: Unremarkable
IMPRESSION: Interval cholecystectomy with mild dilatation of CBD 10 mm diameter,
likely related to cholecystectomy, recommend correlation with LFTs.

Remainder of exam shows no significant abnormalities.

## 2019-11-04 ENCOUNTER — Encounter: Payer: Self-pay | Admitting: Emergency Medicine

## 2019-11-04 ENCOUNTER — Emergency Department
Admission: EM | Admit: 2019-11-04 | Discharge: 2019-11-04 | Disposition: A | Discharge status: Home / Self Care | Payer: HRSA Program | Attending: Emergency Medicine | Admitting: Emergency Medicine

## 2019-11-04 ENCOUNTER — Other Ambulatory Visit: Payer: Self-pay

## 2019-11-04 DIAGNOSIS — F17210 Nicotine dependence, cigarettes, uncomplicated: Secondary | ICD-10-CM | POA: Diagnosis not present

## 2019-11-04 DIAGNOSIS — Z79899 Other long term (current) drug therapy: Secondary | ICD-10-CM | POA: Diagnosis not present

## 2019-11-04 DIAGNOSIS — F1210 Cannabis abuse, uncomplicated: Secondary | ICD-10-CM | POA: Diagnosis not present

## 2019-11-04 DIAGNOSIS — M62830 Muscle spasm of back: Secondary | ICD-10-CM | POA: Insufficient documentation

## 2019-11-04 DIAGNOSIS — U071 COVID-19: Secondary | ICD-10-CM | POA: Insufficient documentation

## 2019-11-04 DIAGNOSIS — R1110 Vomiting, unspecified: Secondary | ICD-10-CM

## 2019-11-04 LAB — COMPREHENSIVE METABOLIC PANEL
ALT: 14 U/L (ref 0–44)
AST: 17 U/L (ref 15–41)
Albumin: 4.1 g/dL (ref 3.5–5.0)
Alkaline Phosphatase: 51 U/L (ref 38–126)
Anion gap: 9 (ref 5–15)
BUN: 13 mg/dL (ref 6–20)
CO2: 30 mmol/L (ref 22–32)
Calcium: 9.3 mg/dL (ref 8.9–10.3)
Chloride: 98 mmol/L (ref 98–111)
Creatinine, Ser: 0.59 mg/dL (ref 0.44–1.00)
GFR calc Af Amer: >60 mL/min (ref >60)
GFR calc non Af Amer: >60 mL/min (ref >60)
Glucose, Bld: 111 mg/dL — ABNORMAL HIGH (ref 70–99)
Potassium: 3.7 mmol/L (ref 3.5–5.1)
Sodium: 137 mmol/L (ref 135–145)
Total Bilirubin: 0.6 mg/dL (ref 0.3–1.2)
Total Protein: 7.7 g/dL (ref 6.5–8.1)

## 2019-11-04 LAB — URINALYSIS, COMPLETE (UACMP) WITH MICROSCOPIC
Bilirubin Urine: NEGATIVE
Glucose, UA: NEGATIVE mg/dL
Ketones, ur: 5 mg/dL — AB
Nitrite: NEGATIVE
Protein, ur: NEGATIVE mg/dL
Specific Gravity, Urine: 1.014 (ref 1.005–1.030)
pH: 6 (ref 5.0–8.0)

## 2019-11-04 LAB — CBC
HCT: 36.1 % (ref 36.0–46.0)
Hemoglobin: 11.6 g/dL — ABNORMAL LOW (ref 12.0–15.0)
MCH: 23.1 pg — ABNORMAL LOW (ref 26.0–34.0)
MCHC: 32.1 g/dL (ref 30.0–36.0)
MCV: 71.8 fL — ABNORMAL LOW (ref 80.0–100.0)
Platelets: 244 10*3/uL (ref 150–400)
RBC: 5.03 MIL/uL (ref 3.87–5.11)
RDW: 14.4 % (ref 11.5–15.5)
WBC: 5.4 10*3/uL (ref 4.0–10.5)
nRBC: 0 % (ref 0.0–0.2)

## 2019-11-04 LAB — LIPASE, BLOOD: Lipase: 22 U/L (ref 11–51)

## 2019-11-04 MED ORDER — ONDANSETRON 4 MG PO TBDP: 4.0000 mg | ORAL_TABLET | Freq: Three times a day (TID) | ORAL | 0 refills | Status: AC | PRN

## 2019-11-04 MED ORDER — OMEPRAZOLE 40 MG PO CPDR: 40.0000 mg | DELAYED_RELEASE_CAPSULE | Freq: Every day | ORAL | 1 refills | Status: AC

## 2019-11-04 MED ORDER — ALUM & MAG HYDROXIDE-SIMETH 200-200-20 MG/5ML PO SUSP
30.0000 mL | Freq: Once | ORAL | Status: AC
  Administered 2019-11-04: 30 mL via ORAL
  Filled 2019-11-04: qty 30

## 2019-11-04 MED ORDER — ONDANSETRON HCL 4 MG/2ML IJ SOLN
4.0000 mg | Freq: Once | INTRAMUSCULAR | Status: AC
  Administered 2019-11-04: 14:00:00 4 mg via INTRAVENOUS
  Filled 2019-11-04: qty 2

## 2019-11-04 MED ORDER — LIDOCAINE VISCOUS HCL 2 % MT SOLN
15.0000 mL | Freq: Once | OROMUCOSAL | Status: AC
  Administered 2019-11-04: 15 mL via ORAL
  Filled 2019-11-04: qty 15

## 2019-11-04 MED ORDER — SODIUM CHLORIDE 0.9% FLUSH: 3.0000 mL | Freq: Once | INTRAVENOUS | Status: DC

## 2019-11-04 MED ORDER — DICYCLOMINE HCL 10 MG PO CAPS: 10.0000 mg | ORAL_CAPSULE | Freq: Four times a day (QID) | ORAL | 0 refills | Status: AC

## 2019-11-04 MED ORDER — SODIUM CHLORIDE 0.9 % IV BOLUS
1000.0000 mL | Freq: Once | INTRAVENOUS | Status: AC
  Administered 2019-11-04: 1000 mL via INTRAVENOUS

## 2019-11-04 NOTE — ED Notes (Signed)
Pt given graham crackers and water.

## 2019-11-04 NOTE — Discharge Instructions (Signed)
Follow-up with your regular doctor if not better in 3 days.  Return emergency department worsening.  Take your medications as prescribed.

## 2019-11-04 NOTE — ED Triage Notes (Signed)
C/O vomiting x 3 days.  AAOx3.  Skin warm and dry. NAD

## 2019-11-04 NOTE — ED Provider Notes (Signed)
[facility] Emergency Department Provider Note  ____________________________________________   First MD Initiated Contact with Patient 11/04/19 1332     (approximate)  I have reviewed the triage vital signs and the nursing notes.   HISTORY  Chief Complaint Emesis    HPI Cathy Sanchez is a 53 y.o. female presents emergency department complaining of vomiting once a day for 3 days.  States she gets spasms in her back with cough.  No fever or chills.  No known Covid exposure.  No diarrhea.  States her stomach gets crampy.  No actual abdominal pain.    Past Medical History:  Diagnosis Date  . Anemia    h/o  . GERD (gastroesophageal reflux disease)    no meds-pt states she has no problems with gerd but has been told she has gerd by pcp  . Lung collapse    right-1st happened during pregnancy when she coughed and then happened again in 2016    Patient Active Problem List   Diagnosis Date Noted  . Calculus of gallbladder without cholecystitis without obstruction     Past Surgical History:  Procedure Laterality Date  . ABDOMINAL HYSTERECTOMY    . CHEST TUBE INSERTION    . CHOLECYSTECTOMY  01/26/2017   Procedure: LAPAROSCOPIC CHOLECYSTECTOMY WITH INTRAOPERATIVE CHOLANGIOGRAM;  Surgeon: Jules Husbands, MD;  Location: [facility] ORS;  Service: General;;  . collapse lung     patient doesn;t remember    Prior to Admission medications   Medication Sig Start Date End Date Taking? Authorizing Provider  dicyclomine (BENTYL) 10 MG capsule Take 1 capsule (10 mg total) by mouth 4 (four) times daily for 14 days. 11/04/19 11/18/19  Devere Brem, Linden Dolin, PA-C  ENSURE PLUS (ENSURE PLUS) LIQD Take 237 mLs by mouth daily.    [provider]  omeprazole (PRILOSEC) 40 MG capsule Take 1 capsule (40 mg total) by mouth daily. 11/04/19 11/03/20  Sahar Ryback, Linden Dolin, PA-C  ondansetron (ZOFRAN-ODT) 4 MG disintegrating tablet Take 1 tablet (4 mg total) by mouth every 8 (eight) hours  as needed. 11/04/19   Shlonda Dolloff, Linden Dolin, PA-C  famotidine (PEPCID) 20 MG tablet Take 1 tablet (20 mg total) by mouth 2 (two) times daily. 11/24/18 11/04/19  Earleen Newport, MD  metoCLOPramide (REGLAN) 5 MG tablet Take 1 tablet (5 mg total) by mouth every 8 (eight) hours as needed for nausea or vomiting. 03/02/17 11/04/19  Menshew, Dannielle Karvonen, PA-C  ranitidine (ZANTAC) 150 MG tablet Take 1 tablet (150 mg total) by mouth 2 (two) times daily. 03/02/17 11/04/19  Menshew, Dannielle Karvonen, PA-C  sucralfate (CARAFATE) 1 g tablet Take 1 tablet (1 g total) by mouth 4 (four) times daily -  with meals and at bedtime. 03/02/17 11/04/19  Menshew, Dannielle Karvonen, PA-C    Allergies Patient has no known allergies.  Family History  Problem Relation Age of Onset  . Cancer Father     Social History Social History   Tobacco Use  . Smoking status: Current Every Day Smoker    Packs/day: 2.00    Years: 30.00    Pack years: 60.00    Types: Cigarettes  . Smokeless tobacco: Never Used  Substance Use Topics  . Alcohol use: Yes    Comment: occ beer and liqour  . Drug use: Yes    Types: Marijuana    Comment: marijuana occassionally 2-3 days ago    Review of Systems  Constitutional: No fever/chills Eyes: No visual changes. ENT: No sore  throat. Respiratory: Denies cough Gastrointestinal: Positive for vomiting x1 daily for 3 days Genitourinary: Negative for dysuria. Musculoskeletal: Negative for back pain. Skin: Negative for rash.    ____________________________________________   PHYSICAL EXAM:  VITAL SIGNS: ED Triage Vitals  Enc Vitals Group     BP 11/04/19 1140 (!) 143/91     Pulse Rate 11/04/19 1140 84     Resp 11/04/19 1140 18     Temp 11/04/19 1140 99.4 F (37.4 C)     Temp Source 11/04/19 1140 Oral     SpO2 11/04/19 1140 100 %     Weight 11/04/19 1115 95 lb 0.3 oz (43.1 kg)     Height 11/04/19 1140 5\' 3"  (1.6 m)     Head Circumference --      Peak Flow --      Pain Score  11/04/19 1115 0     Pain Loc --      Pain Edu? --      Excl. in [place]? --     Constitutional: Alert and oriented. Well appearing and in no acute distress. Eyes: Conjunctivae are normal.  Head: Atraumatic. Nose: No congestion/rhinnorhea. Mouth/Throat: Mucous membranes are moist.   Neck:  supple no lymphadenopathy noted Cardiovascular: Normal rate, regular rhythm. Heart sounds are normal Respiratory: Normal respiratory effort.  No retractions, lungs c t a  Abd: soft nontender bs normal all 4 quad GU: deferred Musculoskeletal: FROM all extremities, warm and well perfused Neurologic:  Normal speech and language.  Skin:  Skin is warm, dry and intact. No rash noted. Psychiatric: Mood and affect are normal. Speech and behavior are normal.  ____________________________________________   LABS (all labs ordered are listed, but only abnormal results are displayed)  Labs Reviewed  COMPREHENSIVE METABOLIC PANEL - Abnormal; Notable for the following components:      Result Value   Glucose, Bld 111 (*)    All other components within normal limits  CBC - Abnormal; Notable for the following components:   Hemoglobin 11.6 (*)    MCV 71.8 (*)    MCH 23.1 (*)    All other components within normal limits  URINALYSIS, COMPLETE (UACMP) WITH MICROSCOPIC - Abnormal; Notable for the following components:   Color, Urine YELLOW (*)    APPearance CLOUDY (*)    Hgb urine dipstick MODERATE (*)    Ketones, ur 5 (*)    Leukocytes,Ua TRACE (*)    Bacteria, UA RARE (*)    All other components within normal limits  URINE CULTURE  SARS CORONAVIRUS 2 (TAT 6-24 HRS)  LIPASE, BLOOD   ____________________________________________   ____________________________________________  RADIOLOGY    ____________________________________________   PROCEDURES  Procedure(s) performed: Saline lock, normal saline 1 L IV, Zofran 4 mg IV, GI cocktail    Procedures    ____________________________________________   INITIAL IMPRESSION / ASSESSMENT AND PLAN / ED COURSE  Pertinent labs & imaging results that were available during my care of the patient were reviewed by me and considered in my medical decision making (see chart for details).   Patient is a 53 year old female presents emergency department complaint of vomiting x1 for 3 days.  Physical exam patient appears well.  Vitals are normal.  Abdomen is not tender to palpation.  CBC has decreased hemoglobin of 11.6 which is very minimal, lipase is normal, comprehensive metabolic panel is normal.  The patient had relief with the medications.  She will be discharged with a prescription for Bentyl, Zofran, and Prilosec.  She is to follow-up  with her regular doctor or return to the emergency department if worsening.  She states she understands will comply.  We did a Covid send out swab at discharge.  She will be notified of these results.    Cathy Sanchez was evaluated in Emergency Department on 11/04/2019 for the symptoms described in the history of present illness. She was evaluated in the context of the global COVID-19 pandemic, which necessitated consideration that the patient might be at risk for infection with the SARS-CoV-2 virus that causes COVID-19. Institutional protocols and algorithms that pertain to the evaluation of patients at risk for COVID-19 are in a state of rapid change based on information released by regulatory bodies including the CDC and federal and state organizations. These policies and algorithms were followed during the patient's care in the ED.   As part of my medical decision making, I reviewed the following data within the [place] notes reviewed and incorporated, Labs reviewed see above, Old chart reviewed, Notes from prior ED visits and [place] Controlled Substance Database  ____________________________________________   FINAL  CLINICAL IMPRESSION(S) / ED DIAGNOSES  Final diagnoses:  Vomiting in adult      NEW MEDICATIONS STARTED DURING THIS VISIT:  Discharge Medication List as of 11/04/2019  4:00 PM    START taking these medications   Details  dicyclomine (BENTYL) 10 MG capsule Take 1 capsule (10 mg total) by mouth 4 (four) times daily for 14 days., Starting Sun 11/04/2019, Until Sun 11/18/2019, Print         Note:  This document was prepared using Dragon voice recognition software and may include unintentional dictation errors.    Versie Starks, PA-C 11/04/19 1805    Harvest Dark, MD 11/06/19 [phone]

## 2019-11-05 LAB — SARS CORONAVIRUS 2 (TAT 6-24 HRS): SARS Coronavirus 2: POSITIVE — AB

## 2019-11-06 LAB — URINE CULTURE: Culture: >=100000 — AB

## 2019-11-07 NOTE — Progress Notes (Signed)
ED Antimicrobial Stewardship Positive Culture Follow Up   Cathy Sanchez is an 53 y.o. female who presented to [facility] on 11/04/2019 with a chief complaint of  Chief Complaint  Patient presents with  . Emesis    Recent Results (from the past 720 hour(s))  Urine Culture     Status: Abnormal   Collection Time: 11/04/19  3:30 PM   Specimen: Urine, Random  Result Value Ref Range Status   Specimen Description   Final    URINE, RANDOM Performed at [facility], [address] [place], [place] [address]    Special Requests   Final    NONE Performed at [facility], [place], [place] [address]    Culture >=100,000 COLONIES/mL ESCHERICHIA COLI (A)  Final   Report Status 11/06/2019 FINAL  Final   Organism ID, Bacteria ESCHERICHIA COLI (A)  Final      Susceptibility   Escherichia coli - MIC*    AMPICILLIN 8 SENSITIVE Sensitive     CEFAZOLIN <=4 SENSITIVE Sensitive     CEFTRIAXONE <=0.25 SENSITIVE Sensitive     CIPROFLOXACIN <=0.25 SENSITIVE Sensitive     GENTAMICIN <=1 SENSITIVE Sensitive     IMIPENEM <=0.25 SENSITIVE Sensitive     NITROFURANTOIN <=16 SENSITIVE Sensitive     TRIMETH/SULFA <=20 SENSITIVE Sensitive     AMPICILLIN/SULBACTAM 8 SENSITIVE Sensitive     PIP/TAZO <=4 SENSITIVE Sensitive     * >=100,000 COLONIES/mL ESCHERICHIA COLI  SARS CORONAVIRUS 2 (TAT 6-24 HRS) Nasopharyngeal Nasopharyngeal Swab     Status: Abnormal   Collection Time: 11/04/19  4:54 PM   Specimen: Nasopharyngeal Swab  Result Value Ref Range Status   SARS Coronavirus 2 POSITIVE (A) NEGATIVE Final    Comment: EMAILED LORI BERDIK AT [id number] ON 11/05/2019  (NOTE) SARS-CoV-2 target nucleic acids are DETECTED. The SARS-CoV-2 RNA is generally detectable in upper and lower respiratory specimens during the acute phase of infection. Positive results are indicative of the presence of SARS-CoV-2 RNA. Clinical correlation with patient history and other diagnostic information  is  necessary to determine patient infection status. Positive results do not rule out bacterial infection or co-infection with other viruses.  The expected result is Negative. Fact Sheet for Patients: SugarRoll.be Fact Sheet for Healthcare Providers: https://www.woods-mathews.com/ This test is not yet approved or cleared by the [place] FDA and  has been authorized for detection and/or diagnosis of SARS-CoV-2 by FDA under an Emergency Use Authorization (EUA). This EUA will remain  in effect (meaning this test can be used) for the duration of the COVID-19 declaration under  Section 564(b)(1) of the Act, 21 U.S.C. section 360bbb-3(b)(1), unless the authorization is terminated or revoked sooner. Performed at [facility], [place] [address], [place],  [address]      [x]  Patient discharged originally without antimicrobial agent and treatment is now desired per MD  New antibiotic prescription: Bactrim DS 1 po BID x 3 days   Called pt at [phone] to obtain preferred pharmacy. Patient pharmacy choice is [facility] on [address] [phone].  Called in above prescription.  ED Provider: Sarina Ser A 11/07/2019, 6:15 PM Clinical Pharmacist

## 2019-11-08 ENCOUNTER — Telehealth: Payer: Self-pay | Admitting: Emergency Medicine

## 2019-11-08 NOTE — Telephone Encounter (Signed)
Called to assure patient is aware of positive covid result.   She is aware.

## 2019-11-13 ENCOUNTER — Ambulatory Visit: Payer: Self-pay | Admitting: *Deleted

## 2019-11-13 NOTE — Telephone Encounter (Signed)
  See notes below Reason for Disposition . Health Information question, no triage required and triager able to answer question  Answer Assessment - Initial Assessment Questions 1. REASON FOR CALL or QUESTION: "What is your reason for calling today?" or "How can I best help you?" or "What question do you have that I can help answer?"     I saw y'all 10/31/2019.  COVID test was positive.   When can I go back to work? I let her know she needed to quarantine from 10/31/2019 which at this point is beyond 10 days.   She feels fine.    Not having symptoms now.     I let her know that was up to her employer to let her know when she can return but as far as her quarantine period that is done.  She thanked me for my help.  Protocols used: INFORMATION ONLY CALL-A-AH
